# Patient Record
Sex: Female | Born: 1945 | Race: White | Hispanic: No | Marital: Single | Smoking: Never smoker
Health system: Southern US, Community
[De-identification: ages and names within clinical notes are randomized; demographics above are authoritative.]

## PROBLEM LIST (undated history)

## (undated) DIAGNOSIS — R002 Palpitations: Secondary | ICD-10-CM

## (undated) DIAGNOSIS — E119 Type 2 diabetes mellitus without complications: Secondary | ICD-10-CM

## (undated) DIAGNOSIS — I341 Nonrheumatic mitral (valve) prolapse: Secondary | ICD-10-CM

## (undated) DIAGNOSIS — R55 Syncope and collapse: Secondary | ICD-10-CM

## (undated) DIAGNOSIS — I340 Nonrheumatic mitral (valve) insufficiency: Secondary | ICD-10-CM

## (undated) HISTORY — DX: Nonrheumatic mitral (valve) prolapse: I34.1

## (undated) HISTORY — DX: Palpitations: R00.2

## (undated) HISTORY — DX: Nonrheumatic mitral (valve) insufficiency: I34.0

## (undated) HISTORY — DX: Syncope and collapse: R55

---

## 1999-04-19 ENCOUNTER — Other Ambulatory Visit: Admission: RE | Admit: 1999-04-19 | Discharge: 1999-04-19 | Payer: Self-pay | Admitting: Internal Medicine

## 1999-12-18 ENCOUNTER — Encounter: Payer: Self-pay | Admitting: Internal Medicine

## 1999-12-18 ENCOUNTER — Encounter: Admission: RE | Admit: 1999-12-18 | Discharge: 1999-12-18 | Payer: Self-pay | Admitting: Internal Medicine

## 2001-01-22 ENCOUNTER — Encounter: Payer: Self-pay | Admitting: Internal Medicine

## 2001-01-22 ENCOUNTER — Encounter: Admission: RE | Admit: 2001-01-22 | Discharge: 2001-01-22 | Payer: Self-pay | Admitting: Internal Medicine

## 2002-04-15 ENCOUNTER — Encounter: Payer: Self-pay | Admitting: Internal Medicine

## 2002-04-15 ENCOUNTER — Encounter: Admission: RE | Admit: 2002-04-15 | Discharge: 2002-04-15 | Payer: Self-pay | Admitting: Internal Medicine

## 2003-04-21 ENCOUNTER — Encounter: Admission: RE | Admit: 2003-04-21 | Discharge: 2003-04-21 | Payer: Self-pay | Admitting: Internal Medicine

## 2003-06-23 ENCOUNTER — Other Ambulatory Visit: Admission: RE | Admit: 2003-06-23 | Discharge: 2003-06-23 | Payer: Self-pay | Admitting: Internal Medicine

## 2004-04-26 ENCOUNTER — Encounter: Admission: RE | Admit: 2004-04-26 | Discharge: 2004-04-26 | Payer: Self-pay | Admitting: Internal Medicine

## 2004-06-28 ENCOUNTER — Other Ambulatory Visit: Admission: RE | Admit: 2004-06-28 | Discharge: 2004-06-28 | Payer: Self-pay | Admitting: Internal Medicine

## 2005-01-17 ENCOUNTER — Emergency Department: Payer: Self-pay | Admitting: Emergency Medicine

## 2005-01-20 ENCOUNTER — Emergency Department: Payer: Self-pay | Admitting: Emergency Medicine

## 2005-01-24 ENCOUNTER — Emergency Department: Payer: Self-pay | Admitting: Unknown Physician Specialty

## 2005-01-31 ENCOUNTER — Emergency Department: Payer: Self-pay | Admitting: Unknown Physician Specialty

## 2005-02-14 ENCOUNTER — Emergency Department: Payer: Self-pay | Admitting: Emergency Medicine

## 2005-05-20 ENCOUNTER — Encounter: Admission: RE | Admit: 2005-05-20 | Discharge: 2005-05-20 | Payer: Self-pay | Admitting: Internal Medicine

## 2005-07-04 ENCOUNTER — Other Ambulatory Visit: Admission: RE | Admit: 2005-07-04 | Discharge: 2005-07-04 | Payer: Self-pay | Admitting: Internal Medicine

## 2006-05-22 ENCOUNTER — Encounter: Admission: RE | Admit: 2006-05-22 | Discharge: 2006-05-22 | Payer: Self-pay | Admitting: Internal Medicine

## 2006-09-22 ENCOUNTER — Other Ambulatory Visit: Admission: RE | Admit: 2006-09-22 | Discharge: 2006-09-22 | Payer: Self-pay | Admitting: Internal Medicine

## 2007-05-28 ENCOUNTER — Encounter: Admission: RE | Admit: 2007-05-28 | Discharge: 2007-05-28 | Payer: Self-pay | Admitting: Internal Medicine

## 2007-09-23 ENCOUNTER — Other Ambulatory Visit: Admission: RE | Admit: 2007-09-23 | Discharge: 2007-09-23 | Payer: Self-pay | Admitting: Internal Medicine

## 2008-06-07 ENCOUNTER — Encounter: Admission: RE | Admit: 2008-06-07 | Discharge: 2008-06-07 | Payer: Self-pay | Admitting: Internal Medicine

## 2008-09-26 ENCOUNTER — Other Ambulatory Visit: Admission: RE | Admit: 2008-09-26 | Discharge: 2008-09-26 | Payer: Self-pay | Admitting: Internal Medicine

## 2009-06-08 ENCOUNTER — Encounter: Admission: RE | Admit: 2009-06-08 | Discharge: 2009-06-08 | Payer: Self-pay | Admitting: Internal Medicine

## 2009-10-11 ENCOUNTER — Other Ambulatory Visit: Admission: RE | Admit: 2009-10-11 | Discharge: 2009-10-11 | Payer: Self-pay | Admitting: Internal Medicine

## 2010-06-11 ENCOUNTER — Encounter
Admission: RE | Admit: 2010-06-11 | Discharge: 2010-06-11 | Payer: Self-pay | Source: Home / Self Care | Attending: Internal Medicine | Admitting: Internal Medicine

## 2010-06-12 ENCOUNTER — Other Ambulatory Visit: Payer: Self-pay | Admitting: Internal Medicine

## 2010-06-12 DIAGNOSIS — R928 Other abnormal and inconclusive findings on diagnostic imaging of breast: Secondary | ICD-10-CM

## 2010-06-17 ENCOUNTER — Ambulatory Visit
Admission: RE | Admit: 2010-06-17 | Discharge: 2010-06-17 | Disposition: A | Discharge status: Home / Self Care | Payer: BC Managed Care – PPO | Source: Ambulatory Visit | Attending: Internal Medicine | Admitting: Internal Medicine

## 2010-06-17 DIAGNOSIS — R928 Other abnormal and inconclusive findings on diagnostic imaging of breast: Secondary | ICD-10-CM

## 2010-10-16 ENCOUNTER — Other Ambulatory Visit (HOSPITAL_COMMUNITY)
Admission: RE | Admit: 2010-10-16 | Discharge: 2010-10-16 | Disposition: A | Discharge status: Home / Self Care | Payer: BC Managed Care – PPO | Source: Ambulatory Visit | Attending: Internal Medicine | Admitting: Internal Medicine

## 2010-10-16 ENCOUNTER — Other Ambulatory Visit: Payer: Self-pay | Admitting: Internal Medicine

## 2010-10-16 DIAGNOSIS — Z01419 Encounter for gynecological examination (general) (routine) without abnormal findings: Secondary | ICD-10-CM | POA: Insufficient documentation

## 2011-01-09 ENCOUNTER — Other Ambulatory Visit: Payer: Self-pay | Admitting: Dermatology

## 2011-05-15 ENCOUNTER — Other Ambulatory Visit: Payer: Self-pay | Admitting: Internal Medicine

## 2011-05-15 DIAGNOSIS — Z1231 Encounter for screening mammogram for malignant neoplasm of breast: Secondary | ICD-10-CM

## 2011-06-19 ENCOUNTER — Ambulatory Visit
Admission: RE | Admit: 2011-06-19 | Discharge: 2011-06-19 | Disposition: A | Discharge status: Home / Self Care | Payer: Medicare Other | Source: Ambulatory Visit | Attending: Internal Medicine | Admitting: Internal Medicine

## 2011-06-19 DIAGNOSIS — Z1231 Encounter for screening mammogram for malignant neoplasm of breast: Secondary | ICD-10-CM

## 2011-08-18 ENCOUNTER — Other Ambulatory Visit: Payer: Self-pay | Admitting: Dermatology

## 2011-08-18 DIAGNOSIS — L500 Allergic urticaria: Secondary | ICD-10-CM | POA: Diagnosis not present

## 2011-08-18 DIAGNOSIS — W57XXXA Bitten or stung by nonvenomous insect and other nonvenomous arthropods, initial encounter: Secondary | ICD-10-CM | POA: Diagnosis not present

## 2011-08-22 DIAGNOSIS — L508 Other urticaria: Secondary | ICD-10-CM | POA: Diagnosis not present

## 2011-09-08 DIAGNOSIS — R030 Elevated blood-pressure reading, without diagnosis of hypertension: Secondary | ICD-10-CM | POA: Diagnosis not present

## 2011-09-08 DIAGNOSIS — L501 Idiopathic urticaria: Secondary | ICD-10-CM | POA: Diagnosis not present

## 2011-09-08 DIAGNOSIS — J309 Allergic rhinitis, unspecified: Secondary | ICD-10-CM | POA: Diagnosis not present

## 2011-10-01 DIAGNOSIS — L821 Other seborrheic keratosis: Secondary | ICD-10-CM | POA: Diagnosis not present

## 2011-10-01 DIAGNOSIS — D239 Other benign neoplasm of skin, unspecified: Secondary | ICD-10-CM | POA: Diagnosis not present

## 2011-10-22 DIAGNOSIS — E559 Vitamin D deficiency, unspecified: Secondary | ICD-10-CM | POA: Diagnosis not present

## 2011-10-22 DIAGNOSIS — E7800 Pure hypercholesterolemia, unspecified: Secondary | ICD-10-CM | POA: Diagnosis not present

## 2011-10-22 DIAGNOSIS — L508 Other urticaria: Secondary | ICD-10-CM | POA: Diagnosis not present

## 2011-10-22 DIAGNOSIS — Z23 Encounter for immunization: Secondary | ICD-10-CM | POA: Diagnosis not present

## 2011-10-22 DIAGNOSIS — Z0000 Encounter for general adult medical examination without abnormal findings: Secondary | ICD-10-CM | POA: Diagnosis not present

## 2011-10-22 DIAGNOSIS — Z79899 Other long term (current) drug therapy: Secondary | ICD-10-CM | POA: Diagnosis not present

## 2011-10-22 DIAGNOSIS — E039 Hypothyroidism, unspecified: Secondary | ICD-10-CM | POA: Diagnosis not present

## 2011-12-31 DIAGNOSIS — M722 Plantar fascial fibromatosis: Secondary | ICD-10-CM | POA: Diagnosis not present

## 2012-01-08 DIAGNOSIS — Z0000 Encounter for general adult medical examination without abnormal findings: Secondary | ICD-10-CM | POA: Diagnosis not present

## 2012-01-26 DIAGNOSIS — Z23 Encounter for immunization: Secondary | ICD-10-CM | POA: Diagnosis not present

## 2012-03-19 DIAGNOSIS — H2510 Age-related nuclear cataract, unspecified eye: Secondary | ICD-10-CM | POA: Diagnosis not present

## 2012-05-31 ENCOUNTER — Other Ambulatory Visit: Payer: Self-pay | Admitting: Internal Medicine

## 2012-05-31 DIAGNOSIS — Z1231 Encounter for screening mammogram for malignant neoplasm of breast: Secondary | ICD-10-CM

## 2012-07-01 ENCOUNTER — Ambulatory Visit
Admission: RE | Admit: 2012-07-01 | Discharge: 2012-07-01 | Disposition: A | Discharge status: Home / Self Care | Payer: Medicare Other | Source: Ambulatory Visit | Attending: Internal Medicine | Admitting: Internal Medicine

## 2012-07-01 DIAGNOSIS — Z1231 Encounter for screening mammogram for malignant neoplasm of breast: Secondary | ICD-10-CM

## 2012-07-06 ENCOUNTER — Ambulatory Visit
Admission: RE | Admit: 2012-07-06 | Discharge: 2012-07-06 | Disposition: A | Discharge status: Home / Self Care | Payer: Medicare Other | Source: Ambulatory Visit | Attending: Internal Medicine | Admitting: Internal Medicine

## 2012-07-06 ENCOUNTER — Ambulatory Visit: Admission: RE | Admit: 2012-07-06 | Payer: Medicare Other | Source: Ambulatory Visit

## 2012-07-06 ENCOUNTER — Other Ambulatory Visit: Payer: Self-pay | Admitting: Internal Medicine

## 2012-10-05 DIAGNOSIS — R69 Illness, unspecified: Secondary | ICD-10-CM | POA: Diagnosis not present

## 2012-10-06 DIAGNOSIS — R030 Elevated blood-pressure reading, without diagnosis of hypertension: Secondary | ICD-10-CM | POA: Diagnosis not present

## 2012-10-06 DIAGNOSIS — R002 Palpitations: Secondary | ICD-10-CM | POA: Diagnosis not present

## 2012-10-26 ENCOUNTER — Other Ambulatory Visit (HOSPITAL_COMMUNITY)
Admission: RE | Admit: 2012-10-26 | Discharge: 2012-10-26 | Disposition: A | Discharge status: Home / Self Care | Payer: Medicare Other | Source: Ambulatory Visit | Attending: Internal Medicine | Admitting: Internal Medicine

## 2012-10-26 ENCOUNTER — Other Ambulatory Visit: Payer: Self-pay | Admitting: Internal Medicine

## 2012-10-26 DIAGNOSIS — R030 Elevated blood-pressure reading, without diagnosis of hypertension: Secondary | ICD-10-CM | POA: Diagnosis not present

## 2012-10-26 DIAGNOSIS — D509 Iron deficiency anemia, unspecified: Secondary | ICD-10-CM | POA: Diagnosis not present

## 2012-10-26 DIAGNOSIS — R7989 Other specified abnormal findings of blood chemistry: Secondary | ICD-10-CM | POA: Diagnosis not present

## 2012-10-26 DIAGNOSIS — Z79899 Other long term (current) drug therapy: Secondary | ICD-10-CM | POA: Diagnosis not present

## 2012-10-26 DIAGNOSIS — Z01419 Encounter for gynecological examination (general) (routine) without abnormal findings: Secondary | ICD-10-CM | POA: Diagnosis not present

## 2012-10-26 DIAGNOSIS — Z1331 Encounter for screening for depression: Secondary | ICD-10-CM | POA: Diagnosis not present

## 2012-10-26 DIAGNOSIS — E7800 Pure hypercholesterolemia, unspecified: Secondary | ICD-10-CM | POA: Diagnosis not present

## 2012-10-26 DIAGNOSIS — E559 Vitamin D deficiency, unspecified: Secondary | ICD-10-CM | POA: Diagnosis not present

## 2012-10-26 DIAGNOSIS — L508 Other urticaria: Secondary | ICD-10-CM | POA: Diagnosis not present

## 2012-10-26 DIAGNOSIS — R350 Frequency of micturition: Secondary | ICD-10-CM | POA: Diagnosis not present

## 2012-10-26 DIAGNOSIS — Z1151 Encounter for screening for human papillomavirus (HPV): Secondary | ICD-10-CM | POA: Diagnosis not present

## 2012-10-26 DIAGNOSIS — Z0000 Encounter for general adult medical examination without abnormal findings: Secondary | ICD-10-CM | POA: Diagnosis not present

## 2012-11-03 ENCOUNTER — Ambulatory Visit
Admission: RE | Admit: 2012-11-03 | Discharge: 2012-11-03 | Disposition: A | Discharge status: Home / Self Care | Payer: Medicare Other | Source: Ambulatory Visit | Attending: Internal Medicine | Admitting: Internal Medicine

## 2012-11-03 ENCOUNTER — Other Ambulatory Visit: Payer: Self-pay | Admitting: Internal Medicine

## 2012-11-03 DIAGNOSIS — R319 Hematuria, unspecified: Secondary | ICD-10-CM

## 2012-11-03 DIAGNOSIS — R3129 Other microscopic hematuria: Secondary | ICD-10-CM | POA: Diagnosis not present

## 2012-11-03 DIAGNOSIS — G500 Trigeminal neuralgia: Secondary | ICD-10-CM | POA: Diagnosis not present

## 2012-11-03 DIAGNOSIS — R002 Palpitations: Secondary | ICD-10-CM | POA: Diagnosis not present

## 2012-11-03 DIAGNOSIS — D509 Iron deficiency anemia, unspecified: Secondary | ICD-10-CM | POA: Diagnosis not present

## 2012-11-09 DIAGNOSIS — D2370 Other benign neoplasm of skin of unspecified lower limb, including hip: Secondary | ICD-10-CM | POA: Diagnosis not present

## 2012-11-09 DIAGNOSIS — D2320 Other benign neoplasm of skin of unspecified ear and external auricular canal: Secondary | ICD-10-CM | POA: Diagnosis not present

## 2012-11-09 DIAGNOSIS — D239 Other benign neoplasm of skin, unspecified: Secondary | ICD-10-CM | POA: Diagnosis not present

## 2012-11-09 DIAGNOSIS — D1801 Hemangioma of skin and subcutaneous tissue: Secondary | ICD-10-CM | POA: Diagnosis not present

## 2012-11-09 DIAGNOSIS — L821 Other seborrheic keratosis: Secondary | ICD-10-CM | POA: Diagnosis not present

## 2012-11-17 ENCOUNTER — Other Ambulatory Visit: Payer: Self-pay | Admitting: Gastroenterology

## 2012-11-17 DIAGNOSIS — D131 Benign neoplasm of stomach: Secondary | ICD-10-CM | POA: Diagnosis not present

## 2012-11-17 DIAGNOSIS — K621 Rectal polyp: Secondary | ICD-10-CM | POA: Diagnosis not present

## 2012-11-17 DIAGNOSIS — D509 Iron deficiency anemia, unspecified: Secondary | ICD-10-CM | POA: Diagnosis not present

## 2012-11-17 DIAGNOSIS — D126 Benign neoplasm of colon, unspecified: Secondary | ICD-10-CM | POA: Diagnosis not present

## 2012-11-17 DIAGNOSIS — K620 Anal polyp: Secondary | ICD-10-CM | POA: Diagnosis not present

## 2012-11-17 DIAGNOSIS — D1330 Benign neoplasm of unspecified part of small intestine: Secondary | ICD-10-CM | POA: Diagnosis not present

## 2012-12-22 DIAGNOSIS — E039 Hypothyroidism, unspecified: Secondary | ICD-10-CM | POA: Diagnosis not present

## 2012-12-22 DIAGNOSIS — L299 Pruritus, unspecified: Secondary | ICD-10-CM | POA: Diagnosis not present

## 2012-12-22 DIAGNOSIS — D509 Iron deficiency anemia, unspecified: Secondary | ICD-10-CM | POA: Diagnosis not present

## 2012-12-22 DIAGNOSIS — R002 Palpitations: Secondary | ICD-10-CM | POA: Diagnosis not present

## 2012-12-22 DIAGNOSIS — R21 Rash and other nonspecific skin eruption: Secondary | ICD-10-CM | POA: Diagnosis not present

## 2013-02-09 DIAGNOSIS — Z23 Encounter for immunization: Secondary | ICD-10-CM | POA: Diagnosis not present

## 2013-02-22 DIAGNOSIS — R21 Rash and other nonspecific skin eruption: Secondary | ICD-10-CM | POA: Diagnosis not present

## 2013-02-22 DIAGNOSIS — R002 Palpitations: Secondary | ICD-10-CM | POA: Diagnosis not present

## 2013-02-22 DIAGNOSIS — D509 Iron deficiency anemia, unspecified: Secondary | ICD-10-CM | POA: Diagnosis not present

## 2013-02-22 DIAGNOSIS — L299 Pruritus, unspecified: Secondary | ICD-10-CM | POA: Diagnosis not present

## 2013-03-09 DIAGNOSIS — H1045 Other chronic allergic conjunctivitis: Secondary | ICD-10-CM | POA: Diagnosis not present

## 2013-06-08 ENCOUNTER — Other Ambulatory Visit: Payer: Self-pay

## 2013-06-08 DIAGNOSIS — Z1231 Encounter for screening mammogram for malignant neoplasm of breast: Secondary | ICD-10-CM

## 2013-06-18 ENCOUNTER — Encounter: Payer: Self-pay | Admitting: *Deleted

## 2013-06-18 DIAGNOSIS — R002 Palpitations: Secondary | ICD-10-CM | POA: Insufficient documentation

## 2013-06-18 DIAGNOSIS — D509 Iron deficiency anemia, unspecified: Secondary | ICD-10-CM | POA: Insufficient documentation

## 2013-07-04 DIAGNOSIS — D2310 Other benign neoplasm of skin of unspecified eyelid, including canthus: Secondary | ICD-10-CM | POA: Diagnosis not present

## 2013-07-04 DIAGNOSIS — H04229 Epiphora due to insufficient drainage, unspecified lacrimal gland: Secondary | ICD-10-CM | POA: Diagnosis not present

## 2013-07-07 ENCOUNTER — Inpatient Hospital Stay: Admission: RE | Admit: 2013-07-07 | Payer: Medicare Other | Source: Ambulatory Visit

## 2013-07-26 ENCOUNTER — Ambulatory Visit
Admission: RE | Admit: 2013-07-26 | Discharge: 2013-07-26 | Disposition: A | Discharge status: Home / Self Care | Payer: Medicare Other | Source: Ambulatory Visit

## 2013-07-26 DIAGNOSIS — Z1231 Encounter for screening mammogram for malignant neoplasm of breast: Secondary | ICD-10-CM

## 2013-07-27 ENCOUNTER — Other Ambulatory Visit: Payer: Self-pay | Admitting: Internal Medicine

## 2013-07-27 DIAGNOSIS — R928 Other abnormal and inconclusive findings on diagnostic imaging of breast: Secondary | ICD-10-CM

## 2013-08-09 ENCOUNTER — Ambulatory Visit
Admission: RE | Admit: 2013-08-09 | Discharge: 2013-08-09 | Disposition: A | Discharge status: Home / Self Care | Payer: Medicare Other | Source: Ambulatory Visit | Attending: Internal Medicine | Admitting: Internal Medicine

## 2013-08-09 ENCOUNTER — Other Ambulatory Visit: Payer: Self-pay | Admitting: Internal Medicine

## 2013-08-09 DIAGNOSIS — R928 Other abnormal and inconclusive findings on diagnostic imaging of breast: Secondary | ICD-10-CM

## 2013-08-09 DIAGNOSIS — N6459 Other signs and symptoms in breast: Secondary | ICD-10-CM | POA: Diagnosis not present

## 2013-08-18 DIAGNOSIS — R319 Hematuria, unspecified: Secondary | ICD-10-CM | POA: Diagnosis not present

## 2013-08-18 DIAGNOSIS — K625 Hemorrhage of anus and rectum: Secondary | ICD-10-CM | POA: Diagnosis not present

## 2013-08-25 DIAGNOSIS — D2370 Other benign neoplasm of skin of unspecified lower limb, including hip: Secondary | ICD-10-CM | POA: Diagnosis not present

## 2013-08-25 DIAGNOSIS — L259 Unspecified contact dermatitis, unspecified cause: Secondary | ICD-10-CM | POA: Diagnosis not present

## 2013-08-25 DIAGNOSIS — L820 Inflamed seborrheic keratosis: Secondary | ICD-10-CM | POA: Diagnosis not present

## 2013-08-25 DIAGNOSIS — L821 Other seborrheic keratosis: Secondary | ICD-10-CM | POA: Diagnosis not present

## 2013-11-04 DIAGNOSIS — L508 Other urticaria: Secondary | ICD-10-CM | POA: Diagnosis not present

## 2013-11-04 DIAGNOSIS — Z1331 Encounter for screening for depression: Secondary | ICD-10-CM | POA: Diagnosis not present

## 2013-11-04 DIAGNOSIS — L299 Pruritus, unspecified: Secondary | ICD-10-CM | POA: Diagnosis not present

## 2013-11-04 DIAGNOSIS — D509 Iron deficiency anemia, unspecified: Secondary | ICD-10-CM | POA: Diagnosis not present

## 2013-11-04 DIAGNOSIS — E559 Vitamin D deficiency, unspecified: Secondary | ICD-10-CM | POA: Diagnosis not present

## 2013-11-04 DIAGNOSIS — R21 Rash and other nonspecific skin eruption: Secondary | ICD-10-CM | POA: Diagnosis not present

## 2013-11-04 DIAGNOSIS — Z0000 Encounter for general adult medical examination without abnormal findings: Secondary | ICD-10-CM | POA: Diagnosis not present

## 2013-11-04 DIAGNOSIS — R002 Palpitations: Secondary | ICD-10-CM | POA: Diagnosis not present

## 2013-11-04 DIAGNOSIS — Z23 Encounter for immunization: Secondary | ICD-10-CM | POA: Diagnosis not present

## 2013-11-04 DIAGNOSIS — R7309 Other abnormal glucose: Secondary | ICD-10-CM | POA: Diagnosis not present

## 2013-11-09 DIAGNOSIS — L821 Other seborrheic keratosis: Secondary | ICD-10-CM | POA: Diagnosis not present

## 2013-11-09 DIAGNOSIS — D2320 Other benign neoplasm of skin of unspecified ear and external auricular canal: Secondary | ICD-10-CM | POA: Diagnosis not present

## 2014-01-11 DIAGNOSIS — D509 Iron deficiency anemia, unspecified: Secondary | ICD-10-CM | POA: Diagnosis not present

## 2014-01-11 DIAGNOSIS — L989 Disorder of the skin and subcutaneous tissue, unspecified: Secondary | ICD-10-CM | POA: Diagnosis not present

## 2014-01-11 DIAGNOSIS — E785 Hyperlipidemia, unspecified: Secondary | ICD-10-CM | POA: Diagnosis not present

## 2014-01-11 DIAGNOSIS — R151 Fecal smearing: Secondary | ICD-10-CM | POA: Diagnosis not present

## 2014-01-11 DIAGNOSIS — R7309 Other abnormal glucose: Secondary | ICD-10-CM | POA: Diagnosis not present

## 2014-01-11 DIAGNOSIS — L299 Pruritus, unspecified: Secondary | ICD-10-CM | POA: Diagnosis not present

## 2014-01-19 DIAGNOSIS — L2089 Other atopic dermatitis: Secondary | ICD-10-CM | POA: Diagnosis not present

## 2014-02-28 DIAGNOSIS — Z23 Encounter for immunization: Secondary | ICD-10-CM | POA: Diagnosis not present

## 2014-07-12 DIAGNOSIS — E119 Type 2 diabetes mellitus without complications: Secondary | ICD-10-CM | POA: Diagnosis not present

## 2014-07-14 ENCOUNTER — Other Ambulatory Visit: Payer: Self-pay

## 2014-07-14 DIAGNOSIS — Z1231 Encounter for screening mammogram for malignant neoplasm of breast: Secondary | ICD-10-CM

## 2014-08-02 DIAGNOSIS — E1165 Type 2 diabetes mellitus with hyperglycemia: Secondary | ICD-10-CM | POA: Diagnosis not present

## 2014-08-02 DIAGNOSIS — R151 Fecal smearing: Secondary | ICD-10-CM | POA: Diagnosis not present

## 2014-08-02 DIAGNOSIS — M7061 Trochanteric bursitis, right hip: Secondary | ICD-10-CM | POA: Diagnosis not present

## 2014-08-02 DIAGNOSIS — E119 Type 2 diabetes mellitus without complications: Secondary | ICD-10-CM | POA: Diagnosis not present

## 2014-08-21 ENCOUNTER — Ambulatory Visit
Admission: RE | Admit: 2014-08-21 | Discharge: 2014-08-21 | Disposition: A | Discharge status: Home / Self Care | Payer: Medicare Other | Source: Ambulatory Visit

## 2014-08-21 DIAGNOSIS — Z1231 Encounter for screening mammogram for malignant neoplasm of breast: Secondary | ICD-10-CM

## 2014-11-16 ENCOUNTER — Other Ambulatory Visit: Payer: Self-pay | Admitting: Internal Medicine

## 2014-11-16 ENCOUNTER — Other Ambulatory Visit (HOSPITAL_COMMUNITY)
Admission: RE | Admit: 2014-11-16 | Discharge: 2014-11-16 | Disposition: A | Discharge status: Home / Self Care | Payer: Medicare Other | Source: Ambulatory Visit | Attending: Internal Medicine | Admitting: Internal Medicine

## 2014-11-16 DIAGNOSIS — Z79899 Other long term (current) drug therapy: Secondary | ICD-10-CM | POA: Diagnosis not present

## 2014-11-16 DIAGNOSIS — E780 Pure hypercholesterolemia: Secondary | ICD-10-CM | POA: Diagnosis not present

## 2014-11-16 DIAGNOSIS — E119 Type 2 diabetes mellitus without complications: Secondary | ICD-10-CM | POA: Diagnosis not present

## 2014-11-16 DIAGNOSIS — E559 Vitamin D deficiency, unspecified: Secondary | ICD-10-CM | POA: Diagnosis not present

## 2014-11-16 DIAGNOSIS — Z124 Encounter for screening for malignant neoplasm of cervix: Secondary | ICD-10-CM | POA: Insufficient documentation

## 2014-11-16 DIAGNOSIS — Z1389 Encounter for screening for other disorder: Secondary | ICD-10-CM | POA: Diagnosis not present

## 2014-11-16 DIAGNOSIS — Z0001 Encounter for general adult medical examination with abnormal findings: Secondary | ICD-10-CM | POA: Diagnosis not present

## 2014-11-16 DIAGNOSIS — R030 Elevated blood-pressure reading, without diagnosis of hypertension: Secondary | ICD-10-CM | POA: Diagnosis not present

## 2014-11-16 DIAGNOSIS — R311 Benign essential microscopic hematuria: Secondary | ICD-10-CM | POA: Diagnosis not present

## 2014-11-16 DIAGNOSIS — D509 Iron deficiency anemia, unspecified: Secondary | ICD-10-CM | POA: Diagnosis not present

## 2014-11-16 DIAGNOSIS — Z8742 Personal history of other diseases of the female genital tract: Secondary | ICD-10-CM | POA: Diagnosis not present

## 2014-11-16 DIAGNOSIS — E039 Hypothyroidism, unspecified: Secondary | ICD-10-CM | POA: Diagnosis not present

## 2014-11-20 DIAGNOSIS — D2239 Melanocytic nevi of other parts of face: Secondary | ICD-10-CM | POA: Diagnosis not present

## 2014-11-20 DIAGNOSIS — D2222 Melanocytic nevi of left ear and external auricular canal: Secondary | ICD-10-CM | POA: Diagnosis not present

## 2014-11-20 DIAGNOSIS — L821 Other seborrheic keratosis: Secondary | ICD-10-CM | POA: Diagnosis not present

## 2014-11-20 LAB — CYTOLOGY - PAP

## 2014-12-18 DIAGNOSIS — M8589 Other specified disorders of bone density and structure, multiple sites: Secondary | ICD-10-CM | POA: Diagnosis not present

## 2014-12-18 DIAGNOSIS — M859 Disorder of bone density and structure, unspecified: Secondary | ICD-10-CM | POA: Diagnosis not present

## 2015-01-08 DIAGNOSIS — H2513 Age-related nuclear cataract, bilateral: Secondary | ICD-10-CM | POA: Diagnosis not present

## 2015-01-08 DIAGNOSIS — H524 Presbyopia: Secondary | ICD-10-CM | POA: Diagnosis not present

## 2015-01-08 DIAGNOSIS — E119 Type 2 diabetes mellitus without complications: Secondary | ICD-10-CM | POA: Diagnosis not present

## 2015-01-30 DIAGNOSIS — Z23 Encounter for immunization: Secondary | ICD-10-CM | POA: Diagnosis not present

## 2015-04-26 DIAGNOSIS — J069 Acute upper respiratory infection, unspecified: Secondary | ICD-10-CM | POA: Diagnosis not present

## 2015-07-26 DIAGNOSIS — J209 Acute bronchitis, unspecified: Secondary | ICD-10-CM | POA: Diagnosis not present

## 2015-07-27 ENCOUNTER — Encounter (HOSPITAL_COMMUNITY): Payer: Self-pay | Admitting: Emergency Medicine

## 2015-07-27 ENCOUNTER — Emergency Department (HOSPITAL_COMMUNITY): Payer: Medicare Other

## 2015-07-27 ENCOUNTER — Emergency Department (HOSPITAL_COMMUNITY)
Admission: EM | Admit: 2015-07-27 | Discharge: 2015-07-27 | Disposition: A | Discharge status: Home / Self Care | Payer: Medicare Other | Attending: Physician Assistant | Admitting: Physician Assistant

## 2015-07-27 DIAGNOSIS — R05 Cough: Secondary | ICD-10-CM | POA: Insufficient documentation

## 2015-07-27 DIAGNOSIS — R0981 Nasal congestion: Secondary | ICD-10-CM | POA: Diagnosis not present

## 2015-07-27 DIAGNOSIS — I341 Nonrheumatic mitral (valve) prolapse: Secondary | ICD-10-CM | POA: Insufficient documentation

## 2015-07-27 DIAGNOSIS — Z880 Allergy status to penicillin: Secondary | ICD-10-CM | POA: Insufficient documentation

## 2015-07-27 DIAGNOSIS — R509 Fever, unspecified: Secondary | ICD-10-CM | POA: Diagnosis not present

## 2015-07-27 DIAGNOSIS — R404 Transient alteration of awareness: Secondary | ICD-10-CM | POA: Diagnosis not present

## 2015-07-27 DIAGNOSIS — Z7984 Long term (current) use of oral hypoglycemic drugs: Secondary | ICD-10-CM | POA: Diagnosis not present

## 2015-07-27 DIAGNOSIS — R55 Syncope and collapse: Secondary | ICD-10-CM

## 2015-07-27 DIAGNOSIS — R110 Nausea: Secondary | ICD-10-CM | POA: Diagnosis not present

## 2015-07-27 DIAGNOSIS — R42 Dizziness and giddiness: Secondary | ICD-10-CM | POA: Diagnosis not present

## 2015-07-27 DIAGNOSIS — Z79899 Other long term (current) drug therapy: Secondary | ICD-10-CM | POA: Insufficient documentation

## 2015-07-27 DIAGNOSIS — R000 Tachycardia, unspecified: Secondary | ICD-10-CM | POA: Diagnosis not present

## 2015-07-27 DIAGNOSIS — R079 Chest pain, unspecified: Secondary | ICD-10-CM | POA: Diagnosis not present

## 2015-07-27 HISTORY — DX: Type 2 diabetes mellitus without complications: E11.9

## 2015-07-27 LAB — CBC WITH DIFFERENTIAL/PLATELET
BASOS ABS: 0 10*3/uL (ref 0.0–0.1)
Basophils Relative: 0 %
EOS PCT: 0 %
Eosinophils Absolute: 0 10*3/uL (ref 0.0–0.7)
HEMATOCRIT: 41.2 % (ref 36.0–46.0)
Hemoglobin: 13.4 g/dL (ref 12.0–15.0)
LYMPHS PCT: 11 %
Lymphs Abs: 0.5 10*3/uL — ABNORMAL LOW (ref 0.7–4.0)
MCH: 29.2 pg (ref 26.0–34.0)
MCHC: 32.5 g/dL (ref 30.0–36.0)
MCV: 89.8 fL (ref 78.0–100.0)
Monocytes Absolute: 0.4 10*3/uL (ref 0.1–1.0)
Monocytes Relative: 9 %
NEUTROS ABS: 3.7 10*3/uL (ref 1.7–7.7)
Neutrophils Relative %: 80 %
PLATELETS: 190 10*3/uL (ref 150–400)
RBC: 4.59 MIL/uL (ref 3.87–5.11)
RDW: 13.5 % (ref 11.5–15.5)
WBC: 4.7 10*3/uL (ref 4.0–10.5)

## 2015-07-27 LAB — COMPREHENSIVE METABOLIC PANEL
ALT: 38 U/L (ref 14–54)
ANION GAP: 10 (ref 5–15)
AST: 37 U/L (ref 15–41)
Albumin: 3.4 g/dL — ABNORMAL LOW (ref 3.5–5.0)
Alkaline Phosphatase: 52 U/L (ref 38–126)
BILIRUBIN TOTAL: 0.3 mg/dL (ref 0.3–1.2)
BUN: 16 mg/dL (ref 6–20)
CO2: 22 mmol/L (ref 22–32)
Calcium: 8.5 mg/dL — ABNORMAL LOW (ref 8.9–10.3)
Chloride: 108 mmol/L (ref 101–111)
Creatinine, Ser: 0.73 mg/dL (ref 0.44–1.00)
GFR calc Af Amer: >60 mL/min (ref >60)
Glucose, Bld: 154 mg/dL — ABNORMAL HIGH (ref 65–99)
POTASSIUM: 4.2 mmol/L (ref 3.5–5.1)
Sodium: 140 mmol/L (ref 135–145)
TOTAL PROTEIN: 6.4 g/dL — AB (ref 6.5–8.1)

## 2015-07-27 LAB — I-STAT TROPONIN, ED: Troponin i, poc: 0 ng/mL (ref 0.00–0.08)

## 2015-07-27 MED ORDER — IOHEXOL 350 MG/ML SOLN
80.0000 mL | Freq: Once | INTRAVENOUS | Status: AC | PRN
  Administered 2015-07-27: 80 mL via INTRAVENOUS

## 2015-07-27 MED ORDER — SODIUM CHLORIDE 0.9 % IV BOLUS (SEPSIS)
1000.0000 mL | Freq: Once | INTRAVENOUS | Status: AC
  Administered 2015-07-27: 1000 mL via INTRAVENOUS

## 2015-07-27 MED ORDER — GUAIFENESIN-CODEINE 100-10 MG/5ML PO SOLN: 5.0000 mL | Freq: Four times a day (QID) | ORAL | Status: DC | PRN

## 2015-07-27 MED ORDER — ONDANSETRON 4 MG PO TBDP
4.0000 mg | ORAL_TABLET | Freq: Once | ORAL | Status: AC
  Administered 2015-07-27: 4 mg via ORAL
  Filled 2015-07-27: qty 1

## 2015-07-27 MED ORDER — PROMETHAZINE HCL 25 MG PO TABS: 25.0000 mg | ORAL_TABLET | Freq: Four times a day (QID) | ORAL | Status: DC | PRN

## 2015-07-27 NOTE — ED Notes (Signed)
Patient transported to CT 

## 2015-07-27 NOTE — ED Notes (Signed)
Pt reports 2 episodes of vasovagal syncope in response to nausea.  Hx of the same.

## 2015-07-27 NOTE — ED Provider Notes (Addendum)
CSN: [id number]     Arrival date & time 07/27/15  1025 History   First MD Initiated Contact with Patient 07/27/15 1035     No chief complaint on file.    (Consider location/radiation/quality/duration/timing/severity/associated sxs/prior Treatment) HPI   70 year old female presenting with vasovagal syncope. Patient has history of chronic vasovagal symptoms syncope. Often associated with nausea.  Yesterday patient was feeling like she had a cough, she was concerned about bronchitis and went to her primary care physician. Patient was sent home with diagnosis of viral syndrome.  This morning she woke up and she felt a little lightheaded and nauseated. She went and unlocked her door she was sure she would need someone to come be with her. She went to the bathroom floor and laid down on it. She said then she "blacked out". She moved to her bed and then she said she "blacked out again". Patient had no fall. Patient says this happens to her multiple times in the past.  She is not on any new medications. Has no chest pain. No recent travel. It was associated after a strong feeling of nausea. She feels much better now that she's received Zofran. She called EMS to be brought to the hospital in order to get a prescription for some type of antiemetic today.   Patient has mild myalgias, feelings of fatigue. Consistent with influenza or viral syndrome that has been going around.        Past Medical History  Diagnosis Date  . Syncope and collapse   . Mitral valve prolapse   . Mitral regurgitation   . Vaso vagal episode     chronic   No past surgical history on file. No family history on file. Social History  Substance Use Topics  . Smoking status: Never Smoker   . Smokeless tobacco: Not on file  . Alcohol Use: No   OB History    No data available     Review of Systems  Constitutional: Positive for fever. Negative for activity change.  HENT: Positive for congestion.   Respiratory:  Positive for cough. Negative for chest tightness.   Cardiovascular: Negative for chest pain.  Gastrointestinal: Negative for abdominal distention.  Genitourinary: Negative for dysuria.  Musculoskeletal: Negative for joint swelling.  Skin: Negative for rash.  Allergic/Immunologic: Negative for immunocompromised state.  Neurological: Negative for seizures and speech difficulty.  Psychiatric/Behavioral: Negative for behavioral problems and agitation.      Allergies  Codeine; Colchicine; Iron; Metformin and related; and Penicillins  Home Medications   Prior to Admission medications   Medication Sig Start Date End Date Taking? Authorizing Provider  acetaminophen (TYLENOL) 500 MG tablet Take 500 mg by mouth every 6 (six) hours as needed for mild pain.   Yes Historical Provider, MD  benzonatate (TESSALON) 100 MG capsule Take 200 mg by mouth 3 (three) times daily as needed for cough.   Yes Historical Provider, MD  Cholecalciferol (VITAMIN D) 2000 UNITS tablet Take 2,000 Units by mouth daily.   Yes Historical Provider, MD  ferrous sulfate 325 (65 FE) MG tablet Take 325 mg by mouth daily with breakfast.   Yes Historical Provider, MD  guaiFENesin (ROBITUSSIN) 100 MG/5ML liquid Take 200 mg by mouth 3 (three) times daily as needed for cough.   Yes Historical Provider, MD  metFORMIN (GLUMETZA) 500 MG (MOD) 24 hr tablet Take 500 mg by mouth daily with breakfast.   Yes Historical Provider, MD  METOPROLOL SUCCINATE ER PO Take 25 mg by mouth daily.  Yes Historical Provider, MD  rosuvastatin (CRESTOR) 10 MG tablet Take 5 mg by mouth daily.   Yes Historical Provider, MD  doxepin (SINEQUAN) 10 MG capsule Take 10 mg by mouth at bedtime as needed.    Historical Provider, MD   BP 103/57 mmHg  Pulse 108  Temp(Src) 99 F (37.2 C) (Oral)  Resp 16  SpO2 94% Physical Exam  Constitutional: She is oriented to person, place, and time. She appears well-developed and well-nourished.  HENT:  Head: Normocephalic  and atraumatic.  Eyes: Conjunctivae are normal. Right eye exhibits no discharge.  Neck: Neck supple.  Cardiovascular: Normal rate, regular rhythm and normal heart sounds.   No murmur heard. Pulmonary/Chest: Effort normal and breath sounds normal. She has no wheezes. She has no rales.  Abdominal: Soft. She exhibits no distension. There is no tenderness.  Musculoskeletal: Normal range of motion. She exhibits no edema.  Neurological: She is oriented to person, place, and time. No cranial nerve deficit.  Skin: Skin is warm and dry. No rash noted. She is not diaphoretic.  Psychiatric: She has a normal mood and affect. Her behavior is normal.  Nursing note and vitals reviewed.   ED Course  Procedures (including critical care time) Labs Review Labs Reviewed  COMPREHENSIVE METABOLIC PANEL - Abnormal; Notable for the following:    Glucose, Bld 154 (*)    Calcium 8.5 (*)    Total Protein 6.4 (*)    Albumin 3.4 (*)    All other components within normal limits  CBC WITH DIFFERENTIAL/PLATELET - Abnormal; Notable for the following:    Lymphs Abs 0.5 (*)    All other components within normal limits  I-STAT TROPOININ, ED    Imaging Review Dg Chest 2 View  07/27/2015  CLINICAL DATA:  Chest pain, nausea and dizziness EXAM: CHEST  2 VIEW COMPARISON:  02/19/2011 FINDINGS: The heart size and mediastinal contours are within normal limits. Both lungs are clear. The visualized skeletal structures are unremarkable. IMPRESSION: No active cardiopulmonary disease. Electronically Signed   By: Jerilynn Mages.  Shick M.D.   On: 07/27/2015 11:57   I have personally reviewed and evaluated these images and lab results as part of my medical decision-making.   EKG Interpretation   Date/Time:  Friday July 27 2015 10:30:10 EDT Ventricular Rate:  106 PR Interval:  224 QRS Duration: 87 QT Interval:  318 QTC Calculation: 422 R Axis:   58 Text Interpretation:  Sinus or ectopic atrial tachycardia Prolonged PR  interval does  not meet STEMI requirements  Normal sinus rhythm No  prolonged QT.   Confirmed by Gerald Leitz ([id number]) on 07/27/2015  11:15:29 AM      MDM   Final diagnoses:  None     Patient is a 70 year old female with history of vasovagal syncope presenting today with vasovagal syncope. (Her chart states mitral valve prolapse but she deneis this history. Patient today was feeling nauseated laid on the floor and then "blacked out". Beforehand she felt hot and chilled. Patient had the same overwhelming feelings again, laid down on the bed and had a syncopal event. Patient is not concerned about the syncope. She says this happens to her often however she would like an anitemetic today.  Patient has a normal physical exam and vital signs at time that I interviewed her.  I suspect flu versus viral illness.  We will get EKG, chest x-ray, labs and initial troponin.   2:10 PM Patietn feels very improved. Does not want admission for syncope, would  like nausea medication and cough medicine.   3:11 PM Despite 2 L of fluids, patient remains tachycardic.  Given syncope, will get CT angio to rule out PE. If negative, will plan to discharge.   Amaurie Wandel Julio Alm, MD 07/27/15 [place], MD 07/27/15 [id number]

## 2015-07-27 NOTE — ED Notes (Signed)
Patient comes from home. Patient was seen and Dx with bronchitis. States woke up this morning  and had syncope. Patient Alert on arrival. Patient given 8mg  zofran with EMS.

## 2015-07-27 NOTE — Discharge Instructions (Signed)
Please return immediately with any concerns.

## 2015-07-27 NOTE — ED Notes (Signed)
Pt ambulated with standby assist to the restroom with ease.

## 2015-08-07 ENCOUNTER — Other Ambulatory Visit: Payer: Self-pay

## 2015-08-07 DIAGNOSIS — Z1231 Encounter for screening mammogram for malignant neoplasm of breast: Secondary | ICD-10-CM

## 2015-09-18 ENCOUNTER — Ambulatory Visit
Admission: RE | Admit: 2015-09-18 | Discharge: 2015-09-18 | Disposition: A | Discharge status: Home / Self Care | Payer: Medicare Other | Source: Ambulatory Visit

## 2015-09-18 DIAGNOSIS — Z1231 Encounter for screening mammogram for malignant neoplasm of breast: Secondary | ICD-10-CM

## 2015-11-20 DIAGNOSIS — D2239 Melanocytic nevi of other parts of face: Secondary | ICD-10-CM | POA: Diagnosis not present

## 2015-11-20 DIAGNOSIS — D1801 Hemangioma of skin and subcutaneous tissue: Secondary | ICD-10-CM | POA: Diagnosis not present

## 2015-11-20 DIAGNOSIS — D2371 Other benign neoplasm of skin of right lower limb, including hip: Secondary | ICD-10-CM | POA: Diagnosis not present

## 2015-11-20 DIAGNOSIS — L821 Other seborrheic keratosis: Secondary | ICD-10-CM | POA: Diagnosis not present

## 2015-11-27 DIAGNOSIS — E7800 Pure hypercholesterolemia, unspecified: Secondary | ICD-10-CM | POA: Diagnosis not present

## 2015-11-27 DIAGNOSIS — E039 Hypothyroidism, unspecified: Secondary | ICD-10-CM | POA: Diagnosis not present

## 2015-11-27 DIAGNOSIS — D509 Iron deficiency anemia, unspecified: Secondary | ICD-10-CM | POA: Diagnosis not present

## 2015-11-27 DIAGNOSIS — Z1389 Encounter for screening for other disorder: Secondary | ICD-10-CM | POA: Diagnosis not present

## 2015-11-27 DIAGNOSIS — Z6830 Body mass index (BMI) 30.0-30.9, adult: Secondary | ICD-10-CM | POA: Diagnosis not present

## 2015-11-27 DIAGNOSIS — E669 Obesity, unspecified: Secondary | ICD-10-CM | POA: Diagnosis not present

## 2015-11-27 DIAGNOSIS — E119 Type 2 diabetes mellitus without complications: Secondary | ICD-10-CM | POA: Diagnosis not present

## 2015-11-27 DIAGNOSIS — Z0001 Encounter for general adult medical examination with abnormal findings: Secondary | ICD-10-CM | POA: Diagnosis not present

## 2015-11-27 DIAGNOSIS — I10 Essential (primary) hypertension: Secondary | ICD-10-CM | POA: Diagnosis not present

## 2015-11-27 DIAGNOSIS — E559 Vitamin D deficiency, unspecified: Secondary | ICD-10-CM | POA: Diagnosis not present

## 2015-11-27 DIAGNOSIS — Z79899 Other long term (current) drug therapy: Secondary | ICD-10-CM | POA: Diagnosis not present

## 2015-11-27 DIAGNOSIS — M8588 Other specified disorders of bone density and structure, other site: Secondary | ICD-10-CM | POA: Diagnosis not present

## 2015-11-30 DIAGNOSIS — W57XXXA Bitten or stung by nonvenomous insect and other nonvenomous arthropods, initial encounter: Secondary | ICD-10-CM | POA: Diagnosis not present

## 2015-11-30 DIAGNOSIS — R6883 Chills (without fever): Secondary | ICD-10-CM | POA: Diagnosis not present

## 2015-12-14 DIAGNOSIS — W57XXXA Bitten or stung by nonvenomous insect and other nonvenomous arthropods, initial encounter: Secondary | ICD-10-CM | POA: Diagnosis not present

## 2015-12-14 DIAGNOSIS — T148 Other injury of unspecified body region: Secondary | ICD-10-CM | POA: Diagnosis not present

## 2016-02-05 DIAGNOSIS — H25813 Combined forms of age-related cataract, bilateral: Secondary | ICD-10-CM | POA: Diagnosis not present

## 2016-02-05 DIAGNOSIS — H353121 Nonexudative age-related macular degeneration, left eye, early dry stage: Secondary | ICD-10-CM | POA: Diagnosis not present

## 2016-02-05 DIAGNOSIS — H524 Presbyopia: Secondary | ICD-10-CM | POA: Diagnosis not present

## 2016-02-06 DIAGNOSIS — Z23 Encounter for immunization: Secondary | ICD-10-CM | POA: Diagnosis not present

## 2016-02-14 DIAGNOSIS — H2512 Age-related nuclear cataract, left eye: Secondary | ICD-10-CM | POA: Diagnosis not present

## 2016-02-14 DIAGNOSIS — H25812 Combined forms of age-related cataract, left eye: Secondary | ICD-10-CM | POA: Diagnosis not present

## 2016-03-13 DIAGNOSIS — E7800 Pure hypercholesterolemia, unspecified: Secondary | ICD-10-CM | POA: Diagnosis not present

## 2016-03-13 DIAGNOSIS — E119 Type 2 diabetes mellitus without complications: Secondary | ICD-10-CM | POA: Diagnosis not present

## 2016-03-13 DIAGNOSIS — Z7984 Long term (current) use of oral hypoglycemic drugs: Secondary | ICD-10-CM | POA: Diagnosis not present

## 2016-03-27 DIAGNOSIS — H25811 Combined forms of age-related cataract, right eye: Secondary | ICD-10-CM | POA: Diagnosis not present

## 2016-03-27 DIAGNOSIS — H2511 Age-related nuclear cataract, right eye: Secondary | ICD-10-CM | POA: Diagnosis not present

## 2016-04-17 DIAGNOSIS — L820 Inflamed seborrheic keratosis: Secondary | ICD-10-CM | POA: Diagnosis not present

## 2016-04-17 DIAGNOSIS — L309 Dermatitis, unspecified: Secondary | ICD-10-CM | POA: Diagnosis not present

## 2016-04-17 DIAGNOSIS — L570 Actinic keratosis: Secondary | ICD-10-CM | POA: Diagnosis not present

## 2016-04-23 ENCOUNTER — Ambulatory Visit (INDEPENDENT_AMBULATORY_CARE_PROVIDER_SITE_OTHER): Payer: Medicare Other | Admitting: Podiatry

## 2016-04-23 ENCOUNTER — Encounter: Payer: Self-pay | Admitting: Podiatry

## 2016-04-23 VITALS — BP 143/71 | HR 64

## 2016-04-23 DIAGNOSIS — M79609 Pain in unspecified limb: Secondary | ICD-10-CM

## 2016-04-23 DIAGNOSIS — L603 Nail dystrophy: Secondary | ICD-10-CM | POA: Diagnosis not present

## 2016-04-23 DIAGNOSIS — B351 Tinea unguium: Secondary | ICD-10-CM | POA: Diagnosis not present

## 2016-04-23 DIAGNOSIS — L608 Other nail disorders: Secondary | ICD-10-CM

## 2016-04-25 MED ORDER — NONFORMULARY OR COMPOUNDED ITEM: 1.0000 [drp] | Freq: Every day | 2 refills | Status: DC

## 2016-04-25 NOTE — Progress Notes (Signed)
Subjective: Patient presents today for possible treatment and evaluation of fungal nails bilaterally great toes. Patient states that the nails have been discolored and thickened for greater than 1 month. Patient presents today for further treatment and evaluation. Patient is also concerned that possibly ingrown. Patient presents today for further treatment and evaluation  Objective: Physical Exam General: The patient is alert and oriented x3 in no acute distress.  Dermatology: Hyperkeratotic, discolored, thickened, onychodystrophy of nails noted bilaterally.  Skin is warm, dry and supple bilateral lower extremities. Negative for open lesions or macerations.  Vascular: Palpable pedal pulses bilaterally. No edema or erythema noted. Capillary refill within normal limits.  Neurological: Epicritic and protective threshold grossly intact bilaterally.   Musculoskeletal Exam: Range of motion within normal limits to all pedal and ankle joints bilateral. Muscle strength 5/5 in all groups bilateral.   Assessment: #1 onychodystrophy bilateral great toenails #2 possible onychomycosis bilateral great toenails  Plan of Care:  #1 Patient was evaluated. #2 today we discussed nail pathology and etiology regarding nail dystrophy. All patient questions were answered. #3 Rx for shertech antifungal nail lacquer #4 return to clinic prn  Edrick Kins, DPM [facility]  Dr. Edrick Kins, [address]                                        [place], [place] [address]                Office [phone]  Fax [phone]

## 2016-04-25 NOTE — Addendum Note (Signed)
Addended by: Johnnye Lana A on: 04/25/2016 04:59 PM   Modules accepted: Orders

## 2016-05-16 ENCOUNTER — Encounter (HOSPITAL_COMMUNITY): Payer: Self-pay | Admitting: Emergency Medicine

## 2016-05-16 ENCOUNTER — Emergency Department (HOSPITAL_COMMUNITY)
Admission: EM | Admit: 2016-05-16 | Discharge: 2016-05-16 | Disposition: A | Discharge status: Home / Self Care | Payer: Medicare Other | Attending: Emergency Medicine | Admitting: Emergency Medicine

## 2016-05-16 DIAGNOSIS — Z79899 Other long term (current) drug therapy: Secondary | ICD-10-CM | POA: Insufficient documentation

## 2016-05-16 DIAGNOSIS — E119 Type 2 diabetes mellitus without complications: Secondary | ICD-10-CM | POA: Diagnosis not present

## 2016-05-16 DIAGNOSIS — Z7984 Long term (current) use of oral hypoglycemic drugs: Secondary | ICD-10-CM | POA: Diagnosis not present

## 2016-05-16 DIAGNOSIS — R112 Nausea with vomiting, unspecified: Secondary | ICD-10-CM | POA: Diagnosis present

## 2016-05-16 DIAGNOSIS — K529 Noninfective gastroenteritis and colitis, unspecified: Secondary | ICD-10-CM | POA: Insufficient documentation

## 2016-05-16 DIAGNOSIS — K591 Functional diarrhea: Secondary | ICD-10-CM | POA: Diagnosis not present

## 2016-05-16 LAB — I-STAT CHEM 8, ED
BUN: 29 mg/dL — ABNORMAL HIGH (ref 6–20)
CALCIUM ION: 1.13 mmol/L — AB (ref 1.15–1.40)
Chloride: 107 mmol/L (ref 101–111)
Creatinine, Ser: 0.7 mg/dL (ref 0.44–1.00)
Glucose, Bld: 163 mg/dL — ABNORMAL HIGH (ref 65–99)
HCT: 37 % (ref 36.0–46.0)
Hemoglobin: 12.6 g/dL (ref 12.0–15.0)
POTASSIUM: 4.4 mmol/L (ref 3.5–5.1)
SODIUM: 142 mmol/L (ref 135–145)
TCO2: 25 mmol/L (ref 0–100)

## 2016-05-16 MED ORDER — ONDANSETRON HCL 4 MG/2ML IJ SOLN
4.0000 mg | Freq: Once | INTRAMUSCULAR | Status: AC
  Administered 2016-05-16: 4 mg via INTRAVENOUS
  Filled 2016-05-16: qty 2

## 2016-05-16 MED ORDER — DIPHENOXYLATE-ATROPINE 2.5-0.025 MG PO TABS: 1.0000 | ORAL_TABLET | Freq: Four times a day (QID) | ORAL | 0 refills | Status: AC | PRN

## 2016-05-16 MED ORDER — SODIUM CHLORIDE 0.9 % IV BOLUS (SEPSIS)
2000.0000 mL | Freq: Once | INTRAVENOUS | Status: AC
  Administered 2016-05-16: 2000 mL via INTRAVENOUS

## 2016-05-16 MED ORDER — ONDANSETRON 4 MG PO TBDP: 4.0000 mg | ORAL_TABLET | Freq: Three times a day (TID) | ORAL | 0 refills | Status: DC | PRN

## 2016-05-16 NOTE — ED Triage Notes (Signed)
Per EMS: Pt to ED for N, V, and pt will have syncope with emesis. Pt c/o diarrhea as well. Pt denies any pain. No blood in emesis or stool.   Pt started vomiting at 0230 and has thrown up 6 times since then and diarrhea every time she has thrown up. Pt denies pain at this time.

## 2016-05-16 NOTE — ED Provider Notes (Addendum)
[facility] DEPT Provider Note   CSN: [id number] Arrival date & time: 05/16/16  0518     History   Chief Complaint Chief Complaint  Patient presents with  . Emesis    HPI Brenda Butler is a 71 y.o. female. She was initially evaluation after an episode of nausea vomiting diarrhea through the night. She states she felt well upon returning to bed last night. Awakened at 2 AM with nausea and vomiting diarrhea. She had marked retching. She had an episode of syncope in the bathroom. States this is "because of my vasovagal episodes". Continues with diarrhea through the night. Feels weak and tired.  HPI  Past Medical History:  Diagnosis Date  . Diabetes mellitus without complication ([place])   . Mitral regurgitation   . Mitral valve prolapse   . Syncope and collapse   . Vaso vagal episode    chronic    Patient Active Problem List   Diagnosis Date Noted  . Heart palpitations 06/18/2013  . Anemia, iron deficiency 06/18/2013    History reviewed. No pertinent surgical history.  OB History    No data available       Home Medications    Prior to Admission medications   Medication Sig Start Date End Date Taking? Authorizing Provider  acetaminophen (TYLENOL) 500 MG tablet Take 500 mg by mouth every 6 (six) hours as needed for mild pain.    Historical Provider, MD  benzonatate (TESSALON) 100 MG capsule Take 200 mg by mouth 3 (three) times daily as needed for cough.    Historical Provider, MD  Cholecalciferol (VITAMIN D) 2000 UNITS tablet Take 2,000 Units by mouth daily.    Historical Provider, MD  diphenoxylate-atropine (LOMOTIL) 2.5-0.025 MG tablet Take 1 tablet by mouth 4 (four) times daily as needed for diarrhea or loose stools. 05/16/16   Tanna Furry, MD  doxepin (SINEQUAN) 10 MG capsule Take 10 mg by mouth at bedtime as needed.    Historical Provider, MD  ferrous sulfate 325 (65 FE) MG tablet Take 325 mg by mouth daily with breakfast.    Historical Provider, MD  guaiFENesin  (ROBITUSSIN) 100 MG/5ML liquid Take 200 mg by mouth 3 (three) times daily as needed for cough.    Historical Provider, MD  guaiFENesin-codeine 100-10 MG/5ML syrup Take 5 mLs by mouth every 6 (six) hours as needed for cough. Patient not taking: Reported on 04/23/2016 07/27/15   Courteney Lyn Mackuen, MD  metFORMIN (GLUMETZA) 500 MG (MOD) 24 hr tablet Take 500 mg by mouth daily with breakfast.    Historical Provider, MD  METOPROLOL SUCCINATE ER PO Take 25 mg by mouth daily.    Historical Provider, MD  NONFORMULARY OR COMPOUNDED ITEM Apply 1 drop topically daily. Nail Lacquer: Fluconazole 2% Terbinafine 1% DMSO 04/25/16   Edrick Kins, DPM  ondansetron (ZOFRAN ODT) 4 MG disintegrating tablet Take 1 tablet (4 mg total) by mouth every 8 (eight) hours as needed for nausea. 05/16/16   Tanna Furry, MD  promethazine (PHENERGAN) 25 MG tablet Take 1 tablet (25 mg total) by mouth every 6 (six) hours as needed for nausea or vomiting. Patient not taking: Reported on 04/23/2016 07/27/15   Courteney Lyn Mackuen, MD  rosuvastatin (CRESTOR) 10 MG tablet Take 5 mg by mouth daily.    Historical Provider, MD    Family History History reviewed. No pertinent family history.  Social History Social History  Substance Use Topics  . Smoking status: Never Smoker  . Smokeless tobacco: Not on file  .  Alcohol use No     Allergies   Codeine; Colchicine; Iron; Metformin and related; and Penicillins   Review of Systems Review of Systems  Constitutional: Negative for appetite change, chills, diaphoresis, fatigue and fever.  HENT: Negative for mouth sores, sore throat and trouble swallowing.   Eyes: Negative for visual disturbance.  Respiratory: Negative for cough, chest tightness, shortness of breath and wheezing.   Cardiovascular: Negative for chest pain.  Gastrointestinal: Positive for diarrhea, nausea and vomiting. Negative for abdominal distention and abdominal pain.  Endocrine: Negative for polydipsia, polyphagia  and polyuria.  Genitourinary: Negative for dysuria, frequency and hematuria.  Musculoskeletal: Negative for gait problem.  Skin: Negative for color change, pallor and rash.  Neurological: Positive for syncope. Negative for dizziness, light-headedness and headaches.  Hematological: Does not bruise/bleed easily.  Psychiatric/Behavioral: Negative for behavioral problems and confusion.     Physical Exam Updated Vital Signs BP 140/70   Pulse 86   Temp 97.7 F (36.5 C) (Oral)   SpO2 94%   Physical Exam  Constitutional: She is oriented to person, place, and time. She appears well-developed and well-nourished. No distress.  HENT:  Head: Normocephalic.  Eyes: Conjunctivae are normal. Pupils are equal, round, and reactive to light. No scleral icterus.  Neck: Normal range of motion. Neck supple. No thyromegaly present.  Cardiovascular: Normal rate and regular rhythm.  Exam reveals no gallop and no friction rub.   No murmur heard. Pulmonary/Chest: Effort normal and breath sounds normal. No respiratory distress. She has no wheezes. She has no rales.  Abdominal: Soft. Bowel sounds are normal. She exhibits no distension. There is no tenderness. There is no rebound.  Musculoskeletal: Normal range of motion.  Neurological: She is alert and oriented to person, place, and time.  Skin: Skin is warm and dry. No rash noted.  Psychiatric: She has a normal mood and affect. Her behavior is normal.     ED Treatments / Results  Labs (all labs ordered are listed, but only abnormal results are displayed) Labs Reviewed  I-STAT CHEM 8, ED - Abnormal; Notable for the following:       Result Value   BUN 29 (*)    Glucose, Bld 163 (*)    Calcium, Ion 1.13 (*)    All other components within normal limits    EKG  EKG Interpretation None       Radiology No results found.  Procedures Procedures (including critical care time)  Medications Ordered in ED Medications  ondansetron (ZOFRAN)  injection 4 mg (4 mg Intravenous Given 05/16/16 0602)  sodium chloride 0.9 % bolus 2,000 mL (2,000 mLs Intravenous New Bag/Given 05/16/16 0604)     Initial Impression / Assessment and Plan / ED Course  I have reviewed the triage vital signs and the nursing notes.  Pertinent labs & imaging results that were available during my care of the patient were reviewed by me and considered in my medical decision making (see chart for details).  Clinical Course    Plan IV fluids anti-medics by mouth trial, and likely home.  Final Clinical Impressions(s) / ED Diagnoses   Final diagnoses:  Gastroenteritis    New Prescriptions New Prescriptions   DIPHENOXYLATE-ATROPINE (LOMOTIL) 2.5-0.025 MG TABLET    Take 1 tablet by mouth 4 (four) times daily as needed for diarrhea or loose stools.   ONDANSETRON (ZOFRAN ODT) 4 MG DISINTEGRATING TABLET    Take 1 tablet (4 mg total) by mouth every 8 (eight) hours as needed for nausea.  Tanna Furry, MD 05/16/16 [place], MD 05/16/16 [phone]

## 2016-05-16 NOTE — ED Notes (Signed)
Pt states she understands instructions. All questions answered. Pt home stable with steady gait.

## 2016-05-16 NOTE — Discharge Instructions (Signed)
Clear liquids today. Slowly advance your diet as your symptoms allow.  Zofran dissolving tablet for nausea. Lomotil as needed for diarrhea.

## 2016-08-15 ENCOUNTER — Other Ambulatory Visit: Payer: Self-pay | Admitting: Internal Medicine

## 2016-08-15 DIAGNOSIS — Z1231 Encounter for screening mammogram for malignant neoplasm of breast: Secondary | ICD-10-CM

## 2016-09-19 ENCOUNTER — Ambulatory Visit
Admission: RE | Admit: 2016-09-19 | Discharge: 2016-09-19 | Disposition: A | Discharge status: Home / Self Care | Payer: Medicare Other | Source: Ambulatory Visit | Attending: Internal Medicine | Admitting: Internal Medicine

## 2016-09-19 DIAGNOSIS — Z1231 Encounter for screening mammogram for malignant neoplasm of breast: Secondary | ICD-10-CM

## 2016-10-03 ENCOUNTER — Inpatient Hospital Stay (HOSPITAL_COMMUNITY)
Admission: EM | Admit: 2016-10-03 | Discharge: 2016-10-06 | DRG: 641 | Disposition: A | Discharge status: Home / Self Care | Payer: Medicare Other | Attending: Family Medicine | Admitting: Family Medicine

## 2016-10-03 ENCOUNTER — Emergency Department (HOSPITAL_COMMUNITY): Payer: Medicare Other

## 2016-10-03 ENCOUNTER — Encounter (HOSPITAL_COMMUNITY): Payer: Self-pay

## 2016-10-03 DIAGNOSIS — R05 Cough: Secondary | ICD-10-CM | POA: Diagnosis not present

## 2016-10-03 DIAGNOSIS — R079 Chest pain, unspecified: Secondary | ICD-10-CM | POA: Diagnosis present

## 2016-10-03 DIAGNOSIS — N390 Urinary tract infection, site not specified: Secondary | ICD-10-CM | POA: Diagnosis present

## 2016-10-03 DIAGNOSIS — I10 Essential (primary) hypertension: Secondary | ICD-10-CM | POA: Diagnosis present

## 2016-10-03 DIAGNOSIS — I341 Nonrheumatic mitral (valve) prolapse: Secondary | ICD-10-CM | POA: Diagnosis present

## 2016-10-03 DIAGNOSIS — Z888 Allergy status to other drugs, medicaments and biological substances status: Secondary | ICD-10-CM

## 2016-10-03 DIAGNOSIS — E860 Dehydration: Principal | ICD-10-CM | POA: Diagnosis present

## 2016-10-03 DIAGNOSIS — R0789 Other chest pain: Secondary | ICD-10-CM | POA: Diagnosis not present

## 2016-10-03 DIAGNOSIS — Z7984 Long term (current) use of oral hypoglycemic drugs: Secondary | ICD-10-CM

## 2016-10-03 DIAGNOSIS — R002 Palpitations: Secondary | ICD-10-CM | POA: Diagnosis present

## 2016-10-03 DIAGNOSIS — D509 Iron deficiency anemia, unspecified: Secondary | ICD-10-CM | POA: Diagnosis present

## 2016-10-03 DIAGNOSIS — Z885 Allergy status to narcotic agent status: Secondary | ICD-10-CM

## 2016-10-03 DIAGNOSIS — I340 Nonrheumatic mitral (valve) insufficiency: Secondary | ICD-10-CM | POA: Diagnosis present

## 2016-10-03 DIAGNOSIS — R55 Syncope and collapse: Secondary | ICD-10-CM | POA: Diagnosis not present

## 2016-10-03 DIAGNOSIS — Z7901 Long term (current) use of anticoagulants: Secondary | ICD-10-CM

## 2016-10-03 DIAGNOSIS — I471 Supraventricular tachycardia: Secondary | ICD-10-CM | POA: Diagnosis not present

## 2016-10-03 DIAGNOSIS — B9620 Unspecified Escherichia coli [E. coli] as the cause of diseases classified elsewhere: Secondary | ICD-10-CM | POA: Diagnosis present

## 2016-10-03 DIAGNOSIS — J069 Acute upper respiratory infection, unspecified: Secondary | ICD-10-CM | POA: Diagnosis not present

## 2016-10-03 DIAGNOSIS — R404 Transient alteration of awareness: Secondary | ICD-10-CM | POA: Diagnosis not present

## 2016-10-03 DIAGNOSIS — E119 Type 2 diabetes mellitus without complications: Secondary | ICD-10-CM | POA: Diagnosis present

## 2016-10-03 DIAGNOSIS — I480 Paroxysmal atrial fibrillation: Secondary | ICD-10-CM | POA: Diagnosis present

## 2016-10-03 DIAGNOSIS — Z880 Allergy status to penicillin: Secondary | ICD-10-CM

## 2016-10-03 DIAGNOSIS — Z79899 Other long term (current) drug therapy: Secondary | ICD-10-CM

## 2016-10-03 DIAGNOSIS — Z87892 Personal history of anaphylaxis: Secondary | ICD-10-CM

## 2016-10-03 LAB — CBC WITH DIFFERENTIAL/PLATELET
BASOS ABS: 0 10*3/uL (ref 0.0–0.1)
BASOS PCT: 0 %
EOS PCT: 0 %
Eosinophils Absolute: 0 10*3/uL (ref 0.0–0.7)
HCT: 42.9 % (ref 36.0–46.0)
Hemoglobin: 13.9 g/dL (ref 12.0–15.0)
Lymphocytes Relative: 10 %
Lymphs Abs: 0.6 10*3/uL — ABNORMAL LOW (ref 0.7–4.0)
MCH: 28.8 pg (ref 26.0–34.0)
MCHC: 32.4 g/dL (ref 30.0–36.0)
MCV: 88.8 fL (ref 78.0–100.0)
MONO ABS: 0.2 10*3/uL (ref 0.1–1.0)
MONOS PCT: 3 %
Neutro Abs: 5.3 10*3/uL (ref 1.7–7.7)
Neutrophils Relative %: 87 %
PLATELETS: 185 10*3/uL (ref 150–400)
RBC: 4.83 MIL/uL (ref 3.87–5.11)
RDW: 13 % (ref 11.5–15.5)
WBC: 6.2 10*3/uL (ref 4.0–10.5)

## 2016-10-03 LAB — COMPREHENSIVE METABOLIC PANEL
ALT: 46 U/L (ref 14–54)
ANION GAP: 7 (ref 5–15)
AST: 38 U/L (ref 15–41)
Albumin: 3.9 g/dL (ref 3.5–5.0)
Alkaline Phosphatase: 60 U/L (ref 38–126)
BILIRUBIN TOTAL: 0.5 mg/dL (ref 0.3–1.2)
BUN: 15 mg/dL (ref 6–20)
CHLORIDE: 105 mmol/L (ref 101–111)
CO2: 24 mmol/L (ref 22–32)
Calcium: 8.6 mg/dL — ABNORMAL LOW (ref 8.9–10.3)
Creatinine, Ser: 0.61 mg/dL (ref 0.44–1.00)
GFR calc Af Amer: >60 mL/min (ref >60)
Glucose, Bld: 160 mg/dL — ABNORMAL HIGH (ref 65–99)
POTASSIUM: 3.9 mmol/L (ref 3.5–5.1)
Sodium: 136 mmol/L (ref 135–145)
TOTAL PROTEIN: 6.6 g/dL (ref 6.5–8.1)

## 2016-10-03 LAB — URINALYSIS, ROUTINE W REFLEX MICROSCOPIC
BILIRUBIN URINE: NEGATIVE
GLUCOSE, UA: NEGATIVE mg/dL
Ketones, ur: 5 mg/dL — AB
NITRITE: NEGATIVE
PH: 6 (ref 5.0–8.0)
Protein, ur: NEGATIVE mg/dL
SPECIFIC GRAVITY, URINE: 1.015 (ref 1.005–1.030)

## 2016-10-03 LAB — I-STAT TROPONIN, ED: Troponin i, poc: 0 ng/mL (ref 0.00–0.08)

## 2016-10-03 LAB — D-DIMER, QUANTITATIVE: D-Dimer, Quant: 0.4 ug/mL-FEU (ref 0.00–0.50)

## 2016-10-03 LAB — I-STAT CG4 LACTIC ACID, ED: Lactic Acid, Venous: 0.88 mmol/L (ref 0.5–1.9)

## 2016-10-03 MED ORDER — SODIUM CHLORIDE 0.9 % IV BOLUS (SEPSIS)
1000.0000 mL | Freq: Once | INTRAVENOUS | Status: AC
  Administered 2016-10-03: 1000 mL via INTRAVENOUS

## 2016-10-03 MED ORDER — SODIUM CHLORIDE 0.9 % IV SOLN
INTRAVENOUS | Status: AC
Start: 2016-10-03 — End: 2016-10-04
  Administered 2016-10-03 – 2016-10-04 (×2): via INTRAVENOUS

## 2016-10-03 MED ORDER — ONDANSETRON HCL 4 MG/2ML IJ SOLN
4.0000 mg | Freq: Once | INTRAMUSCULAR | Status: AC
  Administered 2016-10-03: 4 mg via INTRAVENOUS
  Filled 2016-10-03: qty 2

## 2016-10-03 MED ORDER — LEVOFLOXACIN IN D5W 750 MG/150ML IV SOLN: 750.0000 mg | INTRAVENOUS | Status: DC

## 2016-10-03 MED ORDER — LEVOFLOXACIN IN D5W 250 MG/50ML IV SOLN: 250.0000 mg | INTRAVENOUS | Status: DC

## 2016-10-03 MED ORDER — BACID PO TABS
2.0000 | ORAL_TABLET | Freq: Three times a day (TID) | ORAL | Status: DC
  Administered 2016-10-04 – 2016-10-06 (×7): 2 via ORAL
  Filled 2016-10-03 (×8): qty 2

## 2016-10-03 MED ORDER — DEXTROSE 5 % IV SOLN
1.0000 g | Freq: Every day | INTRAVENOUS | Status: DC
  Administered 2016-10-03 – 2016-10-04 (×2): 1 g via INTRAVENOUS
  Filled 2016-10-03 (×4): qty 10

## 2016-10-03 MED ORDER — PROMETHAZINE HCL 25 MG/ML IJ SOLN
12.5000 mg | Freq: Four times a day (QID) | INTRAMUSCULAR | Status: DC | PRN
  Administered 2016-10-03: 12.5 mg via INTRAVENOUS
  Filled 2016-10-03 (×2): qty 1

## 2016-10-03 NOTE — ED Notes (Addendum)
Patient having chest pain at this time, states that it feels like a pressure.  She states she is feeling worse, now having more nausea with the pain.  MD notified.

## 2016-10-03 NOTE — ED Triage Notes (Signed)
Pt presents to the ed with ems after having an unwitnessed syncopal episode at home. Reports recently having a virus and having a lot of nausea and vomiting.  She denies any injury from the fall. Alert and oriented on arrival.  En route with ems the patient passed out after ems sat her up lasting a couple seconds.  Denies any pain. edp at bedside

## 2016-10-03 NOTE — ED Provider Notes (Signed)
[facility] DEPT Provider Note   CSN: [id number] Arrival date & time: 10/03/16  1717     History   Chief Complaint Chief Complaint  Patient presents with  . Loss of Consciousness    HPI Brenda Butler is a 71 y.o. female hx of DM, syncope, Here presenting with syncope. Patient states that she has been coughing for the last several days so went to her doctor this morning. He was prescribed some cough medicine. This evening, she had some loose stool and then passed out in the bathroom. Denies head injury. Patient states that she passed out before when she had a virus. Denies hx of CAD.   The history is provided by the patient.    Past Medical History:  Diagnosis Date  . Diabetes mellitus without complication ([place])   . Mitral regurgitation   . Mitral valve prolapse   . Syncope and collapse   . Vaso vagal episode    chronic    Patient Active Problem List   Diagnosis Date Noted  . Heart palpitations 06/18/2013  . Anemia, iron deficiency 06/18/2013    History reviewed. No pertinent surgical history.  OB History    No data available       Home Medications    Prior to Admission medications   Medication Sig Start Date End Date Taking? Authorizing Provider  acetaminophen (TYLENOL) 500 MG tablet Take 500 mg by mouth every 6 (six) hours as needed for mild pain.    [provider]  benzonatate (TESSALON) 100 MG capsule Take 200 mg by mouth 3 (three) times daily as needed for cough.    [provider]  Cholecalciferol (VITAMIN D) 2000 UNITS tablet Take 2,000 Units by mouth daily.    [provider]  diphenoxylate-atropine (LOMOTIL) 2.5-0.025 MG tablet Take 1 tablet by mouth 4 (four) times daily as needed for diarrhea or loose stools. 05/16/16   Tanna Furry, MD  doxepin (SINEQUAN) 10 MG capsule Take 10 mg by mouth at bedtime as needed.    [provider]  ferrous sulfate 325 (65 FE) MG tablet Take 325 mg by mouth daily with breakfast.     [provider]  guaiFENesin (ROBITUSSIN) 100 MG/5ML liquid Take 200 mg by mouth 3 (three) times daily as needed for cough.    [provider]  guaiFENesin-codeine 100-10 MG/5ML syrup Take 5 mLs by mouth every 6 (six) hours as needed for cough. Patient not taking: Reported on 04/23/2016 07/27/15   Mackuen, Courteney Lyn, MD  metFORMIN (GLUMETZA) 500 MG (MOD) 24 hr tablet Take 500 mg by mouth daily with breakfast.    [provider]  METOPROLOL SUCCINATE ER PO Take 25 mg by mouth daily.    [provider]  NONFORMULARY OR COMPOUNDED ITEM Apply 1 drop topically daily. Nail Lacquer: Fluconazole 2% Terbinafine 1% DMSO 04/25/16   Evans, Dorathy Daft, DPM  ondansetron (ZOFRAN ODT) 4 MG disintegrating tablet Take 1 tablet (4 mg total) by mouth every 8 (eight) hours as needed for nausea. 05/16/16   Tanna Furry, MD  promethazine (PHENERGAN) 25 MG tablet Take 1 tablet (25 mg total) by mouth every 6 (six) hours as needed for nausea or vomiting. Patient not taking: Reported on 04/23/2016 07/27/15   Mackuen, Courteney Lyn, MD  rosuvastatin (CRESTOR) 10 MG tablet Take 5 mg by mouth daily.    [provider]    Family History Family History  Problem Relation Age of Onset  . Breast cancer Sister  Social History Social History  Substance Use Topics  . Smoking status: Never Smoker  . Smokeless tobacco: Not on file  . Alcohol use No     Allergies   Codeine; Colchicine; Iron; Metformin and related; and Penicillins   Review of Systems Review of Systems  Respiratory: Positive for cough.   Neurological: Positive for syncope.  All other systems reviewed and are negative.    Physical Exam Updated Vital Signs BP (!) 165/64 (BP Location: Left Arm)   Pulse 84   Temp 98.9 F (37.2 C) (Oral)   Resp 15   Ht 5\' 8"  (1.727 m)   Wt 77.1 kg (170 lb)   SpO2 94%   BMI 25.85 kg/m   Physical Exam  Constitutional: She is oriented to person, place, and time.    Dehydrated  HENT:  Head: Normocephalic and atraumatic.  Mouth/Throat: Oropharynx is clear and moist.  Eyes: EOM are normal. Pupils are equal, round, and reactive to light.  Neck: Normal range of motion. Neck supple.  Cardiovascular: Normal rate, regular rhythm and normal heart sounds.   Pulmonary/Chest: Effort normal.  Diminished bilateral bases, no obvious crackles   Abdominal: Soft. Bowel sounds are normal. She exhibits no distension. There is no tenderness.  Musculoskeletal: Normal range of motion.  Neurological: She is alert and oriented to person, place, and time. No cranial nerve deficit. Coordination normal.  Skin: Skin is warm.  Psychiatric: She has a normal mood and affect.  Nursing note and vitals reviewed.    ED Treatments / Results  Labs (all labs ordered are listed, but only abnormal results are displayed) Labs Reviewed  CBC WITH DIFFERENTIAL/PLATELET  COMPREHENSIVE METABOLIC PANEL  URINALYSIS, ROUTINE W REFLEX MICROSCOPIC  I-STAT TROPOININ, ED  I-STAT CG4 LACTIC ACID, ED    EKG  EKG Interpretation  Date/Time:  Friday Oct 03 2016 17:20:58 EDT Ventricular Rate:  73 PR Interval:    QRS Duration: 91 QT Interval:  405 QTC Calculation: 447 R Axis:   76 Text Interpretation:  Sinus rhythm No significant change since last tracing Confirmed by YAO  MD, DAVID ([id number]) on 10/03/2016 5:32:50 PM       Radiology No results found.  Procedures Procedures (including critical care time)  Medications Ordered in ED Medications  sodium chloride 0.9 % bolus 1,000 mL (not administered)     Initial Impression / Assessment and Plan / ED Course  I have reviewed the triage vital signs and the nursing notes.  Pertinent labs & imaging results that were available during my care of the patient were reviewed by me and considered in my medical decision making (see chart for details).     Brenda Butler is a 71 y.o. female here with syncope. Likely vasovagal vs dehydration.  O2 88-94%, no hx of PE. Will get d-dimer, labs, CXR. Will hydrate and reassess.   9:27 PM Patient developed chest pain in the ED. Trop neg. Repeat EKG urnemarkable. Still dizzy and felt light headed. Will admit for observation for syncope, chest pain.   Final Clinical Impressions(s) / ED Diagnoses   Final diagnoses:  None    New Prescriptions New Prescriptions   No medications on file     Drenda Freeze, MD 10/03/16 2129

## 2016-10-03 NOTE — ED Notes (Signed)
Patient transported to CT 

## 2016-10-03 NOTE — ED Notes (Signed)
Patient sheets spoiled upon entering room, attempted to talk patient into changing sheets but she refused stated "that is the least of my worries right now, I don't really want to move". Notified RN of patient wishes.

## 2016-10-03 NOTE — ED Notes (Signed)
Admitting MD paged for more nausea medication; Pt denies chest pain at present, only c/o dizziness and nausea

## 2016-10-03 NOTE — H&P (Signed)
History and Physical    KOREENA JOOST ZGY:[id number] DOB: May 30, 1945 DOA: 10/03/2016  PCP: [name], MD   Patient coming from: Home  Chief Complaint: Syncope  HPI: Brenda Butler is a 71 y.o. female with medical history significant for vasovagal syncope, heart palpitations, who presented to the ED for complaints of passing out, 1 episode today while she was having a bowel movement- which was most likely lose. She gently lowered herself to the floor, says she was out for about 1 minute. Also had 2 episodes of vomiting today. Patient also endorses chest tightness/pressure, the day before, radiating to her jaw. No family history of premature CAD, never smoker.  She endorses fever- temp 99-100, for which she has been taking Advil, also with chills and diaphoresis. She endorses malaise over the past week, denies frequency dysuria abdominal pain, no shortness of breath, but has a cough- nonproductive. Has a headache mostly frontal, no neck pain or photophobia.    Patient reports having presyncopal episodes over the past 18 months, prior to that she hasn't had a syncopal episode in over 20 years. She was previously diagnosed with vasovagal syncope.  ED Course: Blood pressure elevated- 591-638G systolic, temperature- 66.5, respiratory troponin unremarkable, d-dimer negative, normal WBC 6.2, EKG unremarkable, chest x-ray negative for acute cardiopulmonary disease. Patient was given 1 L bolus in the ED, hospitalist was asked to admit for syncope workup and chest pain.  Review of Systems: As per HPI otherwise 10 point review of systems negative.   Past Medical History:  Diagnosis Date  . Diabetes mellitus without complication ([place])   . Mitral regurgitation   . Mitral valve prolapse   . Syncope and collapse   . Vaso vagal episode    chronic    History reviewed. No pertinent surgical history.   reports that she has never smoked. She does not have any smokeless tobacco history on file. She  reports that she does not drink alcohol or use drugs.  Allergies  Allergen Reactions  . Codeine     Nausea and vomiting  . Colchicine     Lightheadedness  . Iron     nausea  . Metformin And Related Diarrhea  . Penicillins     Anaphylaxis and Nausea and vomiting    Family History  Problem Relation Age of Onset  . Breast cancer Sister     Prior to Admission medications   Medication Sig Start Date End Date Taking? Authorizing Provider  acetaminophen (TYLENOL) 500 MG tablet Take 500 mg by mouth every 6 (six) hours as needed for mild pain.   Yes [provider]  benzonatate (TESSALON) 100 MG capsule Take 200 mg by mouth 3 (three) times daily as needed for cough.   Yes [provider]  Cholecalciferol (VITAMIN D) 2000 UNITS tablet Take 2,000 Units by mouth daily.   Yes [provider]  diphenoxylate-atropine (LOMOTIL) 2.5-0.025 MG tablet Take 1 tablet by mouth 4 (four) times daily as needed for diarrhea or loose stools. 05/16/16  Yes Tanna Furry, MD  doxepin (SINEQUAN) 10 MG capsule Take 10 mg by mouth at bedtime as needed (mood).    Yes [provider]  ferrous sulfate 325 (65 FE) MG tablet Take 325 mg by mouth daily with breakfast.   Yes [provider]  guaiFENesin (ROBITUSSIN) 100 MG/5ML liquid Take 200 mg by mouth 3 (three) times daily as needed for cough.   Yes [provider]  metFORMIN (GLUMETZA) 500 MG (MOD) 24 hr tablet  Take 500 mg by mouth daily with breakfast.   Yes [provider]  METOPROLOL SUCCINATE ER PO Take 25 mg by mouth daily.   Yes [provider]  ondansetron (ZOFRAN ODT) 4 MG disintegrating tablet Take 1 tablet (4 mg total) by mouth every 8 (eight) hours as needed for nausea. 05/16/16  Yes Tanna Furry, MD  promethazine (PHENERGAN) 25 MG tablet Take 1 tablet (25 mg total) by mouth every 6 (six) hours as needed for nausea or vomiting. 07/27/15  Yes Mackuen, Courteney Lyn, MD  rosuvastatin (CRESTOR) 10  MG tablet Take 2.5 mg by mouth 3 (three) times a week.    Yes [provider]    Physical Exam: Vitals:   10/03/16 2145 10/03/16 2200 10/03/16 2215 10/03/16 2245  BP: (!) 155/97 (!) 166/76 (!) 171/71 (!) 176/69  Pulse: 88 87 89 92  Resp: 18 19 (!) 22 14  Temp:      TempSrc:      SpO2: 90% 91% 90% 92%  Weight:      Height:        Constitutional: NAD, calm, comfortable Vitals:   10/03/16 2145 10/03/16 2200 10/03/16 2215 10/03/16 2245  BP: (!) 155/97 (!) 166/76 (!) 171/71 (!) 176/69  Pulse: 88 87 89 92  Resp: 18 19 (!) 22 14  Temp:      TempSrc:      SpO2: 90% 91% 90% 92%  Weight:      Height:       Eyes: PERRL, lids and conjunctivae normal ENMT: Mucous membranes are dry. Posterior pharynx clear of any exudate or lesions Neck: normal, supple, no masses, no thyromegaly Respiratory: clear to auscultation bilaterally, no wheezing, no crackles. Normal respiratory effort. No accessory muscle use.  Cardiovascular: Regular rate and rhythm, no murmurs / rubs / gallops. No extremity edema. 2+ pedal pulses.   Abdomen: no tenderness, no masses palpated. No hepatosplenomegaly. Bowel sounds positive.  Musculoskeletal: no clubbing / cyanosis. No joint deformity upper and lower extremities. Good ROM, no contractures. Normal muscle tone.  Skin: no rashes, lesions, ulcers. No induration Neurologic: CN 2-12 grossly intact. Sensation intact, DTR normal. Strength 5/5 in all 4.  Psychiatric: Normal judgment and insight. Alert and oriented x 3. Normal mood.   Labs on Admission: I have personally reviewed following labs and imaging studies  CBC:  Recent Labs Lab 10/03/16 1920  WBC 6.2  NEUTROABS 5.3  HGB 13.9  HCT 42.9  MCV 88.8  PLT 793   Basic Metabolic Panel:  Recent Labs Lab 10/03/16 1920  NA 136  K 3.9  CL 105  CO2 24  GLUCOSE 160*  BUN 15  CREATININE 0.61  CALCIUM 8.6*   Liver Function Tests:  Recent Labs Lab 10/03/16 1920  AST 38  ALT 46  ALKPHOS 60    BILITOT 0.5  PROT 6.6  ALBUMIN 3.9   Urine analysis:    Component Value Date/Time   COLORURINE YELLOW 10/03/2016 1930   APPEARANCEUR CLOUDY (A) 10/03/2016 1930   LABSPEC 1.015 10/03/2016 1930   PHURINE 6.0 10/03/2016 1930   GLUCOSEU NEGATIVE 10/03/2016 1930   HGBUR MODERATE (A) 10/03/2016 1930   BILIRUBINUR NEGATIVE 10/03/2016 1930   KETONESUR 5 (A) 10/03/2016 1930   PROTEINUR NEGATIVE 10/03/2016 1930   NITRITE NEGATIVE 10/03/2016 1930   LEUKOCYTESUR LARGE (A) 10/03/2016 1930    Radiological Exams on Admission: Dg Chest 2 View  Result Date: 10/03/2016 CLINICAL DATA:  Cough and fever EXAM: CHEST  2 VIEW COMPARISON:  07/27/2015 FINDINGS: The heart size and mediastinal contours are within normal limits. Both lungs are clear. The visualized skeletal structures are unremarkable. IMPRESSION: No active cardiopulmonary disease. Electronically Signed   By: Inez Catalina M.D.   On: 10/03/2016 18:16   EKG: Independently reviewed. No ST or T-wave abnormalities. Unchanged from prior compared to 07/29/15.  Assessment/Plan Principal Problem:   Syncope Active Problems:   Heart palpitations   Anemia, iron deficiency   Syncope, vasovagal   Chest pain  Syncope- possibly vasovagal, considering prior diagnoisis, occurred after having a bowel movement. Likely contribution of dehydration- dry mucous membranes. Orthostatics negative in the ED. - Admits to telemetry, obs - Trops x3 - Echo, no prior Echo, evaluate valves, and chest pain work up - Hydrate- X12  Hrs at 125 cc N/s  UTI- UA shows large leukocyte esterase, few bacteria, 6-30 WBCs. Likely cause of low grade temp. Patient has prior history of anaphylactic reaction to penicillin, she has tolerated Keflex in the past. - Start IV ceftriaxone 1 g daily - Follow up urine cultures - Blood cultures 2, with malaise - She also requests probiotics, while on antibiotics, will order - Hydrate  Chest pain- workup per syncope. No chest pain  today. EKG unchanged. Apart from her age she is low risk for coronary artery disease., Never smoker, family history of coronary artery disease. - Trops X3 - Echo - Heart SCore- 2-3 deprending on risk factors, 0.9-1.7% risk of adverse cardiac event. - Doubt need for extensive workup at this time, also pending further workup and clinical status, can follow-up with primary care provider.   Palpitations- she was diagnosed with frequent PACs, for which was prescribed low-dose metoprolol. She denies hypertension. - Continue metoprolol to inpatient - Blood pressure elevated, monitor             DVT prophylaxis: Lovenox Code Status: Full Family Communication: None at bedside, pts friend present. Disposition Plan: Home Consults called: None Admission status: obs, tele.   Bethena Roys MD [facility] Pager [phone]  If 2am-7AM, please contact night-coverage [email or web address] Password [email or web address]  10/03/2016, 11:14 PM

## 2016-10-04 ENCOUNTER — Encounter (HOSPITAL_COMMUNITY): Payer: Self-pay | Admitting: Cardiology

## 2016-10-04 DIAGNOSIS — Z7984 Long term (current) use of oral hypoglycemic drugs: Secondary | ICD-10-CM | POA: Diagnosis not present

## 2016-10-04 DIAGNOSIS — E119 Type 2 diabetes mellitus without complications: Secondary | ICD-10-CM | POA: Diagnosis present

## 2016-10-04 DIAGNOSIS — Z885 Allergy status to narcotic agent status: Secondary | ICD-10-CM | POA: Diagnosis not present

## 2016-10-04 DIAGNOSIS — N390 Urinary tract infection, site not specified: Secondary | ICD-10-CM | POA: Diagnosis present

## 2016-10-04 DIAGNOSIS — Z7901 Long term (current) use of anticoagulants: Secondary | ICD-10-CM | POA: Diagnosis not present

## 2016-10-04 DIAGNOSIS — I10 Essential (primary) hypertension: Secondary | ICD-10-CM | POA: Diagnosis present

## 2016-10-04 DIAGNOSIS — I471 Supraventricular tachycardia: Secondary | ICD-10-CM | POA: Diagnosis present

## 2016-10-04 DIAGNOSIS — I340 Nonrheumatic mitral (valve) insufficiency: Secondary | ICD-10-CM | POA: Diagnosis not present

## 2016-10-04 DIAGNOSIS — I480 Paroxysmal atrial fibrillation: Secondary | ICD-10-CM | POA: Diagnosis not present

## 2016-10-04 DIAGNOSIS — J069 Acute upper respiratory infection, unspecified: Secondary | ICD-10-CM | POA: Diagnosis present

## 2016-10-04 DIAGNOSIS — R55 Syncope and collapse: Secondary | ICD-10-CM | POA: Diagnosis not present

## 2016-10-04 DIAGNOSIS — B9620 Unspecified Escherichia coli [E. coli] as the cause of diseases classified elsewhere: Secondary | ICD-10-CM | POA: Diagnosis present

## 2016-10-04 DIAGNOSIS — R002 Palpitations: Secondary | ICD-10-CM | POA: Diagnosis present

## 2016-10-04 DIAGNOSIS — Z79899 Other long term (current) drug therapy: Secondary | ICD-10-CM | POA: Diagnosis not present

## 2016-10-04 DIAGNOSIS — E860 Dehydration: Secondary | ICD-10-CM | POA: Diagnosis present

## 2016-10-04 DIAGNOSIS — I341 Nonrheumatic mitral (valve) prolapse: Secondary | ICD-10-CM | POA: Diagnosis present

## 2016-10-04 DIAGNOSIS — Z888 Allergy status to other drugs, medicaments and biological substances status: Secondary | ICD-10-CM | POA: Diagnosis not present

## 2016-10-04 DIAGNOSIS — R079 Chest pain, unspecified: Secondary | ICD-10-CM | POA: Diagnosis present

## 2016-10-04 DIAGNOSIS — Z87892 Personal history of anaphylaxis: Secondary | ICD-10-CM | POA: Diagnosis not present

## 2016-10-04 DIAGNOSIS — D509 Iron deficiency anemia, unspecified: Secondary | ICD-10-CM | POA: Diagnosis present

## 2016-10-04 DIAGNOSIS — Z880 Allergy status to penicillin: Secondary | ICD-10-CM | POA: Diagnosis not present

## 2016-10-04 LAB — BASIC METABOLIC PANEL
ANION GAP: 9 (ref 5–15)
BUN: 10 mg/dL (ref 6–20)
CALCIUM: 8.5 mg/dL — AB (ref 8.9–10.3)
CO2: 23 mmol/L (ref 22–32)
Chloride: 107 mmol/L (ref 101–111)
Creatinine, Ser: 0.72 mg/dL (ref 0.44–1.00)
GFR calc Af Amer: >60 mL/min (ref >60)
GLUCOSE: 130 mg/dL — AB (ref 65–99)
Potassium: 3.7 mmol/L (ref 3.5–5.1)
Sodium: 139 mmol/L (ref 135–145)

## 2016-10-04 LAB — CBC
HCT: 40.7 % (ref 36.0–46.0)
HEMOGLOBIN: 13.1 g/dL (ref 12.0–15.0)
MCH: 28.7 pg (ref 26.0–34.0)
MCHC: 32.2 g/dL (ref 30.0–36.0)
MCV: 89.3 fL (ref 78.0–100.0)
Platelets: 181 10*3/uL (ref 150–400)
RBC: 4.56 MIL/uL (ref 3.87–5.11)
RDW: 13.1 % (ref 11.5–15.5)
WBC: 4.8 10*3/uL (ref 4.0–10.5)

## 2016-10-04 LAB — TROPONIN I

## 2016-10-04 LAB — GLUCOSE, CAPILLARY
GLUCOSE-CAPILLARY: 109 mg/dL — AB (ref 65–99)
GLUCOSE-CAPILLARY: 119 mg/dL — AB (ref 65–99)
GLUCOSE-CAPILLARY: 121 mg/dL — AB (ref 65–99)
GLUCOSE-CAPILLARY: 182 mg/dL — AB (ref 65–99)
Glucose-Capillary: 130 mg/dL — ABNORMAL HIGH (ref 65–99)

## 2016-10-04 MED ORDER — ACETAMINOPHEN 650 MG RE SUPP: 650.0000 mg | Freq: Four times a day (QID) | RECTAL | Status: DC | PRN

## 2016-10-04 MED ORDER — PROMETHAZINE HCL 25 MG/ML IJ SOLN: 12.5000 mg | Freq: Four times a day (QID) | INTRAMUSCULAR | Status: DC | PRN

## 2016-10-04 MED ORDER — FAMOTIDINE 20 MG PO TABS
20.0000 mg | ORAL_TABLET | Freq: Two times a day (BID) | ORAL | Status: DC
  Administered 2016-10-04 – 2016-10-06 (×4): 20 mg via ORAL
  Filled 2016-10-04 (×5): qty 1

## 2016-10-04 MED ORDER — IBUPROFEN 600 MG PO TABS
600.0000 mg | ORAL_TABLET | Freq: Four times a day (QID) | ORAL | Status: DC | PRN
  Administered 2016-10-04 – 2016-10-05 (×3): 600 mg via ORAL
  Filled 2016-10-04 (×3): qty 1

## 2016-10-04 MED ORDER — DOXEPIN HCL 10 MG PO CAPS: 10.0000 mg | ORAL_CAPSULE | Freq: Every evening | ORAL | Status: DC | PRN

## 2016-10-04 MED ORDER — ACETAMINOPHEN 325 MG PO TABS
650.0000 mg | ORAL_TABLET | Freq: Four times a day (QID) | ORAL | Status: DC | PRN
  Administered 2016-10-04 – 2016-10-05 (×4): 650 mg via ORAL
  Filled 2016-10-04 (×4): qty 2

## 2016-10-04 MED ORDER — FERROUS SULFATE 325 (65 FE) MG PO TABS
325.0000 mg | ORAL_TABLET | Freq: Every day | ORAL | Status: DC
  Administered 2016-10-05 – 2016-10-06 (×2): 325 mg via ORAL
  Filled 2016-10-04 (×3): qty 1

## 2016-10-04 MED ORDER — ONDANSETRON HCL 4 MG/2ML IJ SOLN: 4.0000 mg | Freq: Four times a day (QID) | INTRAMUSCULAR | Status: DC | PRN

## 2016-10-04 MED ORDER — METOPROLOL TARTRATE 5 MG/5ML IV SOLN
2.5000 mg | Freq: Once | INTRAVENOUS | Status: AC
  Administered 2016-10-04: 2.5 mg via INTRAVENOUS
  Filled 2016-10-04: qty 5

## 2016-10-04 MED ORDER — METOPROLOL SUCCINATE ER 50 MG PO TB24: 50.0000 mg | ORAL_TABLET | Freq: Every day | ORAL | Status: DC

## 2016-10-04 MED ORDER — ENOXAPARIN SODIUM 40 MG/0.4ML ~~LOC~~ SOLN
40.0000 mg | Freq: Every day | SUBCUTANEOUS | Status: DC
  Administered 2016-10-04: 40 mg via SUBCUTANEOUS
  Filled 2016-10-04: qty 0.4

## 2016-10-04 MED ORDER — METOPROLOL SUCCINATE ER 25 MG PO TB24
25.0000 mg | ORAL_TABLET | Freq: Every day | ORAL | Status: DC
Start: 2016-10-04 — End: 2016-10-04
  Administered 2016-10-04: 25 mg via ORAL
  Filled 2016-10-04: qty 1

## 2016-10-04 MED ORDER — METOCLOPRAMIDE HCL 5 MG/ML IJ SOLN
10.0000 mg | Freq: Four times a day (QID) | INTRAMUSCULAR | Status: DC | PRN
Start: 2016-10-04 — End: 2016-10-06
  Administered 2016-10-04 (×3): 10 mg via INTRAVENOUS
  Filled 2016-10-04 (×3): qty 2

## 2016-10-04 MED ORDER — INSULIN ASPART 100 UNIT/ML ~~LOC~~ SOLN
0.0000 [IU] | Freq: Three times a day (TID) | SUBCUTANEOUS | Status: DC
Start: 2016-10-04 — End: 2016-10-06
  Administered 2016-10-04 – 2016-10-05 (×3): 1 [IU] via SUBCUTANEOUS

## 2016-10-04 MED ORDER — VITAMIN D 1000 UNITS PO TABS
2000.0000 [IU] | ORAL_TABLET | Freq: Every day | ORAL | Status: DC
  Administered 2016-10-05 – 2016-10-06 (×2): 2000 [IU] via ORAL
  Filled 2016-10-04 (×3): qty 2

## 2016-10-04 NOTE — Progress Notes (Signed)
Pt converted to Afib with HR 140's.  MD notified and ordered to give metoprolol 2.5mg  IV.

## 2016-10-04 NOTE — Consult Note (Signed)
Cardiology Consultation:   Patient ID: Brenda Butler; [id number]; November 04, 1945   Admit date: 10/03/2016 Date of Consult: 10/04/2016  Primary Care Provider: [name], MD Primary Cardiologist: Dr. Tamala Julian Primary Electrophysiologist:  NA   Patient Profile:   Brenda Butler is a 71 y.o. female with a hx of vasovagal syncope heart palpations DM and MR who is being seen today for the evaluation of rapid a fib at the request of Dr. Wendee Beavers.  History of Present Illness:   Brenda Butler has a hx of vasovagal syncope, MVP, MR, DM2 that was admitted after ER presentation with unwitnessed syncopal episode.  She has had a virus and N & V.  She also had syncope after she sat up with EMS. She was out a couple of seconds.  She has had 3 episodes of syncope in 18 months and while she states this is similar it is much worse than they have been.  These episodes occur when she is having a BM, almost done and then she becomes lightheaded lies down on floor and passes out for 1-2 min per her family.  She is a little disoriented when she comes to but clears.   She has had since a teen ager.  But this is most freq. episodes.    Initial EKG with SR and no acute changes from 07/2015 except slower rate.  Last 3 EKGs reviewed personally personally.  All similar to first one.   During early AM she went into SVT at rate of 170.    Labs troponin negative X 3 Lytes with Cr 0.72, glucose at 130, Ca+ 8.5 CBC WNL with Hgb 13.1 and WBC 4.8.   DDimer 0.40 U/A with large leukocytes. Nitrites neg.  CXR negative for cardiopulmonary disease.  Currently no chest pain, she had some pressure in ER but no EKG changes and troponins neg.  She is quite active normally has [place] she walks and keeps up. No chest pain with this.  She has had cold with fever for a week.  Taking coricidin HBP and delsyn but no pseudoephedrine that she knows of.   Non productive cough.  Now with fever of 102.9.     Past Medical History:    Diagnosis Date  . Diabetes mellitus without complication ([place])   . Mitral regurgitation   . Mitral valve prolapse   . Syncope and collapse   . Vaso vagal episode    chronic    History reviewed. No pertinent surgical history.   OUTPATIENT MEDICATIONS: Inpatient Medications: Scheduled Meds: . cholecalciferol  2,000 Units Oral Daily  . enoxaparin (LOVENOX) injection  40 mg Subcutaneous Daily  . ferrous sulfate  325 mg Oral Q breakfast  . insulin aspart  0-9 Units Subcutaneous TID WC  . lactobacillus acidophilus  2 tablet Oral TID  . metoprolol succinate  25 mg Oral Daily   Continuous Infusions: . sodium chloride 125 mL/hr at 10/04/16 1029  . cefTRIAXone (ROCEPHIN)  IV Stopped (10/04/16 0232)   PRN Meds: acetaminophen **OR** acetaminophen, doxepin, metoCLOPramide (REGLAN) injection, promethazine  Allergies:    Allergies  Allergen Reactions  . Codeine     Nausea and vomiting  . Colchicine     Lightheadedness  . Iron     nausea  . Metformin And Related Diarrhea  . Penicillins     Anaphylaxis and Nausea and vomiting    Social History:   Social History   Social History  . Marital status: Single  Spouse name: N/A  . Number of children: N/A  . Years of education: N/A   Occupational History  . Not on file.   Social History Main Topics  . Smoking status: Never Smoker  . Smokeless tobacco: Never Used  . Alcohol use No  . Drug use: No  . Sexual activity: Not on file   Other Topics Concern  . Not on file   Social History Narrative  . No narrative on file    Family History:   The patient's family history includes Arrhythmia in her mother; Breast cancer in her sister; CVA in her father.  ROS:    General:+ colds + fevers, no weight changes Skin:no rashes or ulcers HEENT:no blurred vision, no congestion CV:see HPI PUL:see HPI GI:no diarrhea constipation or melena, no indigestion GU:no hematuria, no dysuria MS:+ lt hip pain- arthritis, no  claudication Neuro:+ syncope, no lightheadedness Endo:+ diabetes, no thyroid disease     Physical Exam/Data:   Vitals:   10/03/16 2330 10/03/16 2345 10/04/16 0014 10/04/16 0927  BP: (!) 167/86 (!) 156/65 (!) 167/75 (!) 141/86  Pulse:   83 (!) 150  Resp: 20 18 18    Temp:   99.6 F (37.6 C) (!) 102.9 F (39.4 C)  TempSrc:   Oral Oral  SpO2:   93%   Weight:   167 lb (75.8 kg)   Height:   5\' 4"  (1.626 m)     Intake/Output Summary (Last 24 hours) at 10/04/16 1134 Last data filed at 10/03/16 2300  Gross per 24 hour  Intake             1000 ml  Output                0 ml  Net             1000 ml   Filed Weights   10/03/16 1718 10/04/16 0014  Weight: 170 lb (77.1 kg) 167 lb (75.8 kg)   Body mass index is 28.67 kg/m.  General:  Well nourished, well developed, in no acute distress, but with fever - flushed and diaphoretic HEENT: normal Lymph: no adenopathy Neck: no JVD Endocrine:  No thryomegaly Vascular: No carotid bruits;2+ pedal pulses Cardiac:  Somewhat irregular;  no murmur, gallup or rub  Lungs:  clear to auscultation bilaterally, no wheezing, rhonchi or rales  Abd: soft, nontender, no hepatomegaly  Ext: no edema Musculoskeletal:  No deformities, BUE and BLE strength normal and equal Skin: warm to hot and moist. flushed Neuro:  A &O X 3 MAE follows commands. no focal abnormalities noted Psych:  Normal affect     Relevant CV Studies: ECHO is pending  Laboratory Data:  Chemistry  Recent Labs Lab 10/03/16 1920 10/04/16 0625  NA 136 139  K 3.9 3.7  CL 105 107  CO2 24 23  GLUCOSE 160* 130*  BUN 15 10  CREATININE 0.61 0.72  CALCIUM 8.6* 8.5*  GFRNONAA >60 >60  GFRAA >60 >60  ANIONGAP 7 9     Recent Labs Lab 10/03/16 1920  PROT 6.6  ALBUMIN 3.9  AST 38  ALT 46  ALKPHOS 60  BILITOT 0.5   Hematology  Recent Labs Lab 10/03/16 1920 10/04/16 0625  WBC 6.2 4.8  RBC 4.83 4.56  HGB 13.9 13.1  HCT 42.9 40.7  MCV 88.8 89.3  MCH 28.8 28.7   MCHC 32.4 32.2  RDW 13.0 13.1  PLT 185 181   Cardiac Enzymes  Recent Labs Lab 10/04/16 0109 10/04/16  5726  TROPONINI <0.03 <0.03     Recent Labs Lab 10/03/16 1930  TROPIPOC 0.00    BNPNo results for input(s): BNP, PROBNP in the last 168 hours.  DDimer   Recent Labs Lab 10/03/16 1925  DDIMER 0.40    Radiology/Studies:  Dg Chest 2 View  Result Date: 10/03/2016 CLINICAL DATA:  Cough and fever EXAM: CHEST  2 VIEW COMPARISON:  07/27/2015 FINDINGS: The heart size and mediastinal contours are within normal limits. Both lungs are clear. The visualized skeletal structures are unremarkable. IMPRESSION: No active cardiopulmonary disease. Electronically Signed   By: Inez Catalina M.D.   On: 10/03/2016 18:16    Assessment and Plan:    Principal Problem:   Syncope Active Problems:   Heart palpitations   Anemia, iron deficiency   Syncope, vasovagal   Chest pain   1. SVT in pt with fever 102.9 this AM and now multifocal atrial tach, and viral syndrome with normal WBC.  2. Syncope hx vasovagal syncope, was not orthostatic in ER.  3. Hx palpitations have always been PACs   4. Anemia - hx iron def anemia   5. Hx of MR per record.   Signed, Cecilie Kicks, NP  10/04/2016 11:34 AM As above; pt seen and examined; Briefly she is a 71 year old female with past medical history of diabetes mellitus for evaluation of syncope. Patient has had intermittent syncope for years. Her episodes typically occur when she has a viral illness and there is associated nausea. Her most recent episode occurred with an ongoing upper respiratory infection. She went to have a bowel movement and then developed nausea followed by diaphoresis, dizziness. She lay down on the floor and was unconscious for approximately 30 seconds to 1 minute by report. This is similar to her previous episodes. She denies preceding chest pain, dyspnea or palpitations. She otherwise does not have dyspnea on exertion, orthopnea,  PND, pedal edema or exertional chest pain. Cardiology is now asked to evaluate. Note she was seen by Dr. Tamala Julian some years ago and had ectopy noted and metoprolol initiated. On telemetry she is having what appears to be paroxysmal atrial tachycardia.  Electrocardiogram shows sinus rhythm with no ST changes. Troponins are normal.   1 syncope-symptoms are clearly vagally mediated. I stressed the importance of staying hydrated. Each episode has also occurred in the setting of a viral illness. Echocardiogram has been ordered to assess LV function.  2 paroxysmal atrial tachycardia-noted on telemetry. This appears to be long-standing and may be exacerbated by her viral illness. Increase Toprol to 50 mg daily.  3 hypertension-blood pressure is elevated. Increase Toprol to 50 mg daily and follow.  4 viral illness-management per primary care.  Kirk Ruths, MD

## 2016-10-04 NOTE — Progress Notes (Signed)
Pt's temp spiked 102.9, BP 141/86, HR Afib low 100's. MD notified. Blood cultures ordered.

## 2016-10-04 NOTE — Progress Notes (Signed)
PROGRESS NOTE    Brenda Butler  ZWC:[id number] DOB: 1945/07/30 DOA: 10/03/2016 PCP: [name], MD   Brief Narrative:  71 y.o. female with medical history significant for vasovagal syncope, heart palpitations, who presented to the ED for complaints of passing out, 1 episode today while she was having a bowel movement- which was most likely lose. She gently lowered herself to the floor, says she was out for about 1 minute. Also had 2 episodes of vomiting today.  The patient has been diagnosed with syncope of unknown etiology.  Assessment & Plan:   Principal Problem:   Syncope - I suspect that there is a vasovagal component. But with this new onset of atrial fibrillation I'm wondering whether this is contributing and making her symptoms worse. Of note patient also complains of sinus infection and was found to have a UTI.  Atrial fibrillation - consulted cardiology  UTI - Awaiting urine culture results although was obtained from bed pan as such do not suspect it will be accurate. We'll continue with Rocephin at this point.  Active Problems:   Anemia, iron deficiency - Stable continue to monitor   DVT prophylaxis: Lovenox Code Status: Full Family Communication: d/c patient Disposition Plan: pending work up   Consultants:   Cardiology   Procedures: none   Antimicrobials: Rocephin   Subjective: Pt has many questions answered to her satisfaction  Objective: Vitals:   10/04/16 0014 10/04/16 0927 10/04/16 1200 10/04/16 1725  BP: (!) 167/75 (!) 141/86 135/60   Pulse: 83 (!) 150 78   Resp: 18  18   Temp: 99.6 F (37.6 C) (!) 102.9 F (39.4 C) 99.6 F (37.6 C) 100.2 F (37.9 C)  TempSrc: Oral Oral Oral Axillary  SpO2: 93%  98%   Weight: 75.8 kg (167 lb)     Height: 5\' 4"  (1.626 m)       Intake/Output Summary (Last 24 hours) at 10/04/16 1744 Last data filed at 10/03/16 2300  Gross per 24 hour  Intake             1000 ml  Output                0 ml  Net              1000 ml   Filed Weights   10/03/16 1718 10/04/16 0014  Weight: 77.1 kg (170 lb) 75.8 kg (167 lb)    Examination:  General exam: Appears calm and comfortable, in nad. Respiratory system: Clear to auscultation. Respiratory effort normal. Cardiovascular system: S1 & S2 heard, RRR.  Gastrointestinal system: Abdomen is nondistended, soft and nontender.  Central nervous system: Alert and oriented. No focal neurological deficits. Extremities: Symmetric 5 x 5 power. Skin: No rashes, lesions or ulcers, on limited exam. Psychiatry:  Mood & affect appropriate.   Data Reviewed: I have personally reviewed following labs and imaging studies  CBC:  Recent Labs Lab 10/03/16 1920 10/04/16 0625  WBC 6.2 4.8  NEUTROABS 5.3  --   HGB 13.9 13.1  HCT 42.9 40.7  MCV 88.8 89.3  PLT 185 235   Basic Metabolic Panel:  Recent Labs Lab 10/03/16 1920 10/04/16 0625  NA 136 139  K 3.9 3.7  CL 105 107  CO2 24 23  GLUCOSE 160* 130*  BUN 15 10  CREATININE 0.61 0.72  CALCIUM 8.6* 8.5*   GFR: Estimated Creatinine Clearance: 65.2 mL/min (by C-G formula based on SCr of 0.72 mg/dL). Liver Function Tests:  Recent Labs  Lab 10/03/16 1920  AST 38  ALT 46  ALKPHOS 60  BILITOT 0.5  PROT 6.6  ALBUMIN 3.9   No results for input(s): LIPASE, AMYLASE in the last 168 hours. No results for input(s): AMMONIA in the last 168 hours. Coagulation Profile: No results for input(s): INR, PROTIME in the last 168 hours. Cardiac Enzymes:  Recent Labs Lab 10/04/16 0109 10/04/16 0625 10/04/16 1120  TROPONINI <0.03 <0.03 <0.03   BNP (last 3 results) No results for input(s): PROBNP in the last 8760 hours. HbA1C: No results for input(s): HGBA1C in the last 72 hours. CBG:  Recent Labs Lab 10/04/16 0024 10/04/16 0606 10/04/16 1139 10/04/16 1640  GLUCAP 182* 119* 130* 121*   Lipid Profile: No results for input(s): CHOL, HDL, LDLCALC, TRIG, CHOLHDL, LDLDIRECT in the last 72 hours. Thyroid  Function Tests: No results for input(s): TSH, T4TOTAL, FREET4, T3FREE, THYROIDAB in the last 72 hours. Anemia Panel: No results for input(s): VITAMINB12, FOLATE, FERRITIN, TIBC, IRON, RETICCTPCT in the last 72 hours. Sepsis Labs:  Recent Labs Lab 10/03/16 1932  LATICACIDVEN 0.88    No results found for this or any previous visit (from the past 240 hour(s)).       Radiology Studies: Dg Chest 2 View  Result Date: 10/03/2016 CLINICAL DATA:  Cough and fever EXAM: CHEST  2 VIEW COMPARISON:  07/27/2015 FINDINGS: The heart size and mediastinal contours are within normal limits. Both lungs are clear. The visualized skeletal structures are unremarkable. IMPRESSION: No active cardiopulmonary disease. Electronically Signed   By: Inez Catalina M.D.   On: 10/03/2016 18:16        Scheduled Meds: . cholecalciferol  2,000 Units Oral Daily  . enoxaparin (LOVENOX) injection  40 mg Subcutaneous Daily  . ferrous sulfate  325 mg Oral Q breakfast  . insulin aspart  0-9 Units Subcutaneous TID WC  . lactobacillus acidophilus  2 tablet Oral TID  . [START ON 10/05/2016] metoprolol succinate  50 mg Oral Daily   Continuous Infusions: . cefTRIAXone (ROCEPHIN)  IV Stopped (10/04/16 0232)     LOS: 0 days    Time spent: > 35 minutes  Velvet Bathe, MD [facility] Pager [phone]  If 7PM-7AM, please contact night-coverage [email or web address] Password [facility] 10/04/2016, 5:44 PM

## 2016-10-05 ENCOUNTER — Inpatient Hospital Stay (HOSPITAL_COMMUNITY): Payer: Medicare Other

## 2016-10-05 DIAGNOSIS — I340 Nonrheumatic mitral (valve) insufficiency: Secondary | ICD-10-CM

## 2016-10-05 DIAGNOSIS — I480 Paroxysmal atrial fibrillation: Secondary | ICD-10-CM

## 2016-10-05 LAB — ECHOCARDIOGRAM COMPLETE
Height: 64 in
Weight: 2672 oz

## 2016-10-05 LAB — GLUCOSE, CAPILLARY
Glucose-Capillary: 125 mg/dL — ABNORMAL HIGH (ref 65–99)
Glucose-Capillary: 102 mg/dL — ABNORMAL HIGH (ref 65–99)
Glucose-Capillary: 119 mg/dL — ABNORMAL HIGH (ref 65–99)
Glucose-Capillary: 112 mg/dL — ABNORMAL HIGH (ref 65–99)

## 2016-10-05 MED ORDER — APIXABAN 5 MG PO TABS
5.0000 mg | ORAL_TABLET | Freq: Two times a day (BID) | ORAL | Status: DC
  Administered 2016-10-05 – 2016-10-06 (×3): 5 mg via ORAL
  Filled 2016-10-05 (×3): qty 1

## 2016-10-05 MED ORDER — DEXTROSE 5 % IV SOLN
1.0000 g | Freq: Every day | INTRAVENOUS | Status: DC
  Administered 2016-10-05: 1 g via INTRAVENOUS
  Filled 2016-10-05 (×2): qty 10

## 2016-10-05 MED ORDER — METOPROLOL SUCCINATE ER 50 MG PO TB24
50.0000 mg | ORAL_TABLET | Freq: Every day | ORAL | Status: DC
  Administered 2016-10-05: 50 mg via ORAL
  Filled 2016-10-05: qty 1

## 2016-10-05 MED ORDER — METOPROLOL SUCCINATE ER 50 MG PO TB24
75.0000 mg | ORAL_TABLET | Freq: Every day | ORAL | Status: DC
  Administered 2016-10-06: 75 mg via ORAL
  Filled 2016-10-05: qty 1

## 2016-10-05 NOTE — Progress Notes (Signed)
Notiff Dr. Olevia Bowens of [facility] -Lopressor 50 mg po ordered.

## 2016-10-05 NOTE — Discharge Instructions (Signed)

## 2016-10-05 NOTE — Progress Notes (Signed)
Progress Note  Patient Name: Brenda Butler Date of Encounter: 10/05/2016  Primary Cardiologist: Dr Stanford Breed  Subjective   Pt denies CP or dyspnea; fever improving this AM  Inpatient Medications    Scheduled Meds: . cholecalciferol  2,000 Units Oral Daily  . enoxaparin (LOVENOX) injection  40 mg Subcutaneous Daily  . famotidine  20 mg Oral BID  . ferrous sulfate  325 mg Oral Q breakfast  . insulin aspart  0-9 Units Subcutaneous TID WC  . lactobacillus acidophilus  2 tablet Oral TID  . metoprolol succinate  50 mg Oral Daily   Continuous Infusions: . cefTRIAXone (ROCEPHIN)  IV Stopped (10/04/16 2314)   PRN Meds: acetaminophen **OR** acetaminophen, doxepin, ibuprofen, metoCLOPramide (REGLAN) injection, promethazine   Vital Signs    Vitals:   10/04/16 1916 10/04/16 2147 10/05/16 0515 10/05/16 0709  BP:   135/82   Pulse:   91 (!) 140  Resp:   18   Temp: 99.7 F (37.6 C) (!) 102.5 F (39.2 C) 98.9 F (37.2 C)   TempSrc: Oral Oral Oral   SpO2:   95%   Weight:      Height:        Intake/Output Summary (Last 24 hours) at 10/05/16 0817 Last data filed at 10/04/16 1200  Gross per 24 hour  Intake              240 ml  Output                0 ml  Net              240 ml   Filed Weights   10/03/16 1718 10/04/16 0014  Weight: 77.1 kg (170 lb) 75.8 kg (167 lb)    Telemetry    Sinus with PAF- Personally Reviewed  Physical Exam   GEN: No acute distress.   Neck: Supple Cardiac: RRR Respiratory: CTA GI: Soft, nontender, non-distended, no masses MS: No edema Neuro:  Grossly intact Psych: Normal   Labs    Chemistry Recent Labs Lab 10/03/16 1920 10/04/16 0625  NA 136 139  K 3.9 3.7  CL 105 107  CO2 24 23  GLUCOSE 160* 130*  BUN 15 10  CREATININE 0.61 0.72  CALCIUM 8.6* 8.5*  PROT 6.6  --   ALBUMIN 3.9  --   AST 38  --   ALT 46  --   ALKPHOS 60  --   BILITOT 0.5  --   GFRNONAA >60 >60  GFRAA >60 >60  ANIONGAP 7 9     Hematology Recent  Labs Lab 10/03/16 1920 10/04/16 0625  WBC 6.2 4.8  RBC 4.83 4.56  HGB 13.9 13.1  HCT 42.9 40.7  MCV 88.8 89.3  MCH 28.8 28.7  MCHC 32.4 32.2  RDW 13.0 13.1  PLT 185 181    Cardiac Enzymes Recent Labs Lab 10/04/16 0109 10/04/16 0625 10/04/16 1120  TROPONINI <0.03 <0.03 <0.03    Recent Labs Lab 10/03/16 1930  TROPIPOC 0.00      DDimer  Recent Labs Lab 10/03/16 1925  DDIMER 0.40     Radiology    Dg Chest 2 View  Result Date: 10/03/2016 CLINICAL DATA:  Cough and fever EXAM: CHEST  2 VIEW COMPARISON:  07/27/2015 FINDINGS: The heart size and mediastinal contours are within normal limits. Both lungs are clear. The visualized skeletal structures are unremarkable. IMPRESSION: No active cardiopulmonary disease. Electronically Signed   By: Inez Catalina M.D.   On: 10/03/2016  18:16     Patient Profile     71 year old female with past medical history of diabetes mellitus for evaluation of syncope. Felt to be vasovagal. Also with PAT and PAF on telemetry.  Assessment & Plan    1 syncope-symptoms felt to be vagally mediated. Each episode occurred in the setting of a viral illness and bowel movement. Pt instructed previously on importance of staying hydrated. Echocardiogram has been ordered to assess LV function.  2 paroxysmal atrial tachycardia and PAF-atrial tachycardia noted on telemetry previously and PAF over last 24 hours. Increase Toprol to 75 mg daily. CHADSvasc 3. Add apixaban 5 mg BID; check echo and TSH.  3 hypertension-blood pressure is improved; continue present meds and follow.  4 viral illness-management per primary care.  Signed, Kirk Ruths, MD  10/05/2016, 8:17 AM

## 2016-10-05 NOTE — Progress Notes (Addendum)
PROGRESS NOTE    Brenda Butler  JQZ:[id number] DOB: October 18, 1945 DOA: 10/03/2016 PCP: [name], MD   Brief Narrative:  71 y.o. female with medical history significant for vasovagal syncope, heart palpitations, who presented to the ED for complaints of passing out, 1 episode today while she was having a bowel movement- which was most likely lose. She gently lowered herself to the floor, says she was out for about 1 minute. Also had 2 episodes of vomiting today.  The patient has been diagnosed with syncope of unknown etiology.  Assessment & Plan:   Principal Problem:   Syncope - I suspect that there is a vasovagal component. Addendum: probably compounded by sinus and urinary  Infections  Atrial fibrillation - consulted cardiology. New problem pt started on Toprol-XL and Eliquis  UTI - Urine cx has grown out E coli - Awaiting sensitivities   Active Problems:   Anemia, iron deficiency - Stable continue to monitor   DVT prophylaxis: Lovenox Code Status: Full Family Communication: d/c patient Disposition Plan: pending work up   Consultants:   Cardiology   Procedures: none   Antimicrobials: Rocephin   Subjective: Pt has no new complaints.  Objective: Vitals:   10/05/16 0515 10/05/16 0709 10/05/16 1120 10/05/16 1240  BP: 135/82   (!) 132/51  Pulse: 91 (!) 140  75  Resp: 18   18  Temp: 98.9 F (37.2 C)  99.7 F (37.6 C) 99 F (37.2 C)  TempSrc: Oral  Oral Oral  SpO2: 95%   92%  Weight:      Height:        Intake/Output Summary (Last 24 hours) at 10/05/16 1707 Last data filed at 10/05/16 1600  Gross per 24 hour  Intake              630 ml  Output                0 ml  Net              630 ml   Filed Weights   10/03/16 1718 10/04/16 0014  Weight: 77.1 kg (170 lb) 75.8 kg (167 lb)    Examination:  General exam: Appears calm and comfortable, in nad. Respiratory system: Clear to auscultation. Respiratory effort normal. Cardiovascular system: S1  & S2 heard, RRR.  Gastrointestinal system: Abdomen is nondistended, soft and nontender.  Central nervous system: Alert and oriented. No focal neurological deficits. Extremities: Symmetric 5 x 5 power. Skin: No rashes, lesions or ulcers, on limited exam. Psychiatry:  Mood & affect appropriate.   Data Reviewed: I have personally reviewed following labs and imaging studies  CBC:  Recent Labs Lab 10/03/16 1920 10/04/16 0625  WBC 6.2 4.8  NEUTROABS 5.3  --   HGB 13.9 13.1  HCT 42.9 40.7  MCV 88.8 89.3  PLT 185 622   Basic Metabolic Panel:  Recent Labs Lab 10/03/16 1920 10/04/16 0625  NA 136 139  K 3.9 3.7  CL 105 107  CO2 24 23  GLUCOSE 160* 130*  BUN 15 10  CREATININE 0.61 0.72  CALCIUM 8.6* 8.5*   GFR: Estimated Creatinine Clearance: 65.2 mL/min (by C-G formula based on SCr of 0.72 mg/dL). Liver Function Tests:  Recent Labs Lab 10/03/16 1920  AST 38  ALT 46  ALKPHOS 60  BILITOT 0.5  PROT 6.6  ALBUMIN 3.9   No results for input(s): LIPASE, AMYLASE in the last 168 hours. No results for input(s): AMMONIA in the last 168 hours.  Coagulation Profile: No results for input(s): INR, PROTIME in the last 168 hours. Cardiac Enzymes:  Recent Labs Lab 10/04/16 0109 10/04/16 0625 10/04/16 1120  TROPONINI <0.03 <0.03 <0.03   BNP (last 3 results) No results for input(s): PROBNP in the last 8760 hours. HbA1C: No results for input(s): HGBA1C in the last 72 hours. CBG:  Recent Labs Lab 10/04/16 1640 10/04/16 2137 10/05/16 0602 10/05/16 1111 10/05/16 1624  GLUCAP 121* 109* 125* 102* 119*   Lipid Profile: No results for input(s): CHOL, HDL, LDLCALC, TRIG, CHOLHDL, LDLDIRECT in the last 72 hours. Thyroid Function Tests: No results for input(s): TSH, T4TOTAL, FREET4, T3FREE, THYROIDAB in the last 72 hours. Anemia Panel: No results for input(s): VITAMINB12, FOLATE, FERRITIN, TIBC, IRON, RETICCTPCT in the last 72 hours. Sepsis Labs:  Recent Labs Lab  10/03/16 1932  LATICACIDVEN 0.88    Recent Results (from the past 240 hour(s))  Culture, Urine     Status: Abnormal (Preliminary result)   Collection Time: 10/03/16  7:30 PM  Result Value Ref Range Status   Specimen Description URINE, RANDOM  Final   Special Requests NONE  Final   Culture (A)  Final    >=100,000 COLONIES/mL ESCHERICHIA COLI SUSCEPTIBILITIES TO FOLLOW    Report Status PENDING  Incomplete  Culture, blood (routine x 2)     Status: None (Preliminary result)   Collection Time: 10/04/16  9:53 AM  Result Value Ref Range Status   Specimen Description BLOOD RIGHT ARM  Final   Special Requests   Final    BOTTLES DRAWN AEROBIC AND ANAEROBIC Blood Culture adequate volume   Culture NO GROWTH 1 DAY  Final   Report Status PENDING  Incomplete  Culture, blood (routine x 2)     Status: None (Preliminary result)   Collection Time: 10/04/16  9:58 AM  Result Value Ref Range Status   Specimen Description BLOOD RIGHT HAND  Final   Special Requests   Final    BOTTLES DRAWN AEROBIC AND ANAEROBIC Blood Culture adequate volume   Culture NO GROWTH 1 DAY  Final   Report Status PENDING  Incomplete      Radiology Studies: Dg Chest 2 View  Result Date: 10/03/2016 CLINICAL DATA:  Cough and fever EXAM: CHEST  2 VIEW COMPARISON:  07/27/2015 FINDINGS: The heart size and mediastinal contours are within normal limits. Both lungs are clear. The visualized skeletal structures are unremarkable. IMPRESSION: No active cardiopulmonary disease. Electronically Signed   By: Inez Catalina M.D.   On: 10/03/2016 18:16    Scheduled Meds: . apixaban  5 mg Oral BID  . cholecalciferol  2,000 Units Oral Daily  . famotidine  20 mg Oral BID  . ferrous sulfate  325 mg Oral Q breakfast  . insulin aspart  0-9 Units Subcutaneous TID WC  . lactobacillus acidophilus  2 tablet Oral TID  . [START ON 10/06/2016] metoprolol succinate  75 mg Oral Daily   Continuous Infusions: . cefTRIAXone (ROCEPHIN)  IV 1 g (10/05/16  1540)     LOS: 1 day    Time spent: > 35 minutes  Velvet Bathe, MD [facility] Pager [phone]  If 7PM-7AM, please contact night-coverage [email or web address] Password [email or web address] 10/05/2016, 5:07 PM

## 2016-10-05 NOTE — Progress Notes (Signed)
Echocardiogram 2D Echocardiogram has been performed.  Brenda Butler 10/05/2016, 9:33 AM

## 2016-10-06 DIAGNOSIS — I471 Supraventricular tachycardia: Secondary | ICD-10-CM

## 2016-10-06 DIAGNOSIS — I4719 Other supraventricular tachycardia: Secondary | ICD-10-CM

## 2016-10-06 DIAGNOSIS — I480 Paroxysmal atrial fibrillation: Secondary | ICD-10-CM

## 2016-10-06 DIAGNOSIS — Z7901 Long term (current) use of anticoagulants: Secondary | ICD-10-CM

## 2016-10-06 LAB — GLUCOSE, CAPILLARY: GLUCOSE-CAPILLARY: 117 mg/dL — AB (ref 65–99)

## 2016-10-06 LAB — URINE CULTURE

## 2016-10-06 LAB — TSH: TSH: 3.115 u[IU]/mL (ref 0.350–4.500)

## 2016-10-06 MED ORDER — METOPROLOL SUCCINATE ER 25 MG PO TB24
75.0000 mg | ORAL_TABLET | Freq: Every day | ORAL | 0 refills | Status: DC
Start: 2016-10-06 — End: 2016-10-28

## 2016-10-06 MED ORDER — APIXABAN 5 MG PO TABS: 5.0000 mg | ORAL_TABLET | Freq: Two times a day (BID) | ORAL | 0 refills | Status: DC

## 2016-10-06 NOTE — Progress Notes (Signed)
Order received to discharge patient.  Telemetry monitor removed and CCMD notified.  PIV access removed.  Discharge instructions, follow up, medications and instructions for their use were discussed with patient. 

## 2016-10-06 NOTE — Progress Notes (Signed)
Progress Note  Patient Name: Brenda Butler Date of Encounter: 10/06/2016  Primary Cardiologist: Tamala Julian  Subjective   Admitted for febrile illness and cardiology consult and due to recurrent syncope. Vagal mediation appears most likely cause. Instructed concerning episodes/prodrome to avoid injury and recurrent syncope.  Inpatient Medications    Scheduled Meds: . apixaban  5 mg Oral BID  . cholecalciferol  2,000 Units Oral Daily  . famotidine  20 mg Oral BID  . ferrous sulfate  325 mg Oral Q breakfast  . insulin aspart  0-9 Units Subcutaneous TID WC  . lactobacillus acidophilus  2 tablet Oral TID  . metoprolol succinate  75 mg Oral Daily   Continuous Infusions: . cefTRIAXone (ROCEPHIN)  IV Stopped (10/05/16 1610)   PRN Meds: acetaminophen **OR** acetaminophen, doxepin, ibuprofen, metoCLOPramide (REGLAN) injection, promethazine   Vital Signs    Vitals:   10/05/16 1723 10/05/16 2106 10/06/16 0346 10/06/16 0655  BP:  (!) 149/61 139/69   Pulse:  81 73   Resp:  18 18   Temp: 99.9 F (37.7 C) (!) 101.6 F (38.7 C) (!) 100.5 F (38.1 C) 99.3 F (37.4 C)  TempSrc: Oral Oral Oral Oral  SpO2:  92% 91%   Weight:      Height:        Intake/Output Summary (Last 24 hours) at 10/06/16 0921 Last data filed at 10/05/16 1808  Gross per 24 hour  Intake              630 ml  Output                0 ml  Net              630 ml   Filed Weights   10/03/16 1718 10/04/16 0014  Weight: 170 lb (77.1 kg) 167 lb (75.8 kg)    Telemetry    Normal sinus rhythm with brief episodes of MAT and atrial fibrillation - Personally Reviewed  ECG    On 10/03/2016, normal-appearing EKG was normal sinus rhythm - Personally Reviewed  Physical Exam  Appears healthy GEN: No acute distress. Speech has nasal quality/congestion.   Neck: No JVD Cardiac: RRR, no murmurs, rubs, or gallops.  Respiratory: Clear to auscultation bilaterally. GI: Soft, nontender, non-distended  MS: No edema; No  deformity. Neuro:  Nonfocal  Psych: Normal affect   Labs    Chemistry Recent Labs Lab 10/03/16 1920 10/04/16 0625  NA 136 139  K 3.9 3.7  CL 105 107  CO2 24 23  GLUCOSE 160* 130*  BUN 15 10  CREATININE 0.61 0.72  CALCIUM 8.6* 8.5*  PROT 6.6  --   ALBUMIN 3.9  --   AST 38  --   ALT 46  --   ALKPHOS 60  --   BILITOT 0.5  --   GFRNONAA >60 >60  GFRAA >60 >60  ANIONGAP 7 9     Hematology Recent Labs Lab 10/03/16 1920 10/04/16 0625  WBC 6.2 4.8  RBC 4.83 4.56  HGB 13.9 13.1  HCT 42.9 40.7  MCV 88.8 89.3  MCH 28.8 28.7  MCHC 32.4 32.2  RDW 13.0 13.1  PLT 185 181    Cardiac Enzymes Recent Labs Lab 10/04/16 0109 10/04/16 0625 10/04/16 1120  TROPONINI <0.03 <0.03 <0.03    Recent Labs Lab 10/03/16 1930  TROPIPOC 0.00     BNPNo results for input(s): BNP, PROBNP in the last 168 hours.   DDimer  Recent Labs Lab 10/03/16  1925  DDIMER 0.40     Radiology    No results found.  Cardiac Studies   Echocardiogram 10/05/16: Study Conclusions  - Left ventricle: The cavity size was normal. Wall thickness was   normal. Systolic function was vigorous. The estimated ejection   fraction was in the range of 65% to 70%. Wall motion was normal;   there were no regional wall motion abnormalities. Doppler   parameters are consistent with abnormal left ventricular   relaxation (grade 1 diastolic dysfunction). - Mitral valve: There was mild regurgitation. - Pulmonary arteries: Systolic pressure was moderately increased.  Impressions:  - Normal LV systolic function; mild diastolic dysfunction; mild MR;   mild TR with moderately elevated pulmonary pressure.   Patient Profile     71 y.o. female with past medical history of diabetes mellitus for evaluation of syncope. Felt to be vasovagal. Also with PAT and PAF on telemetry.   Assessment & Plan    1. Recurrent neurally-mediated syncope, usually in the setting of febrile illness. Prodrome is GI with  nausea and diaphoresis. 2. Paroxysmal atrial fibrillation as well as PAT/MAT. Anticoagulation was started because of CHADS VASC >/= 2. With history of recurrent syncope, she is at risk for injury and bleeding on anticoagulation. 3. Blood pressure is improved.  Ultimately, I was suggest an outpatient electrophysiology consultation concerning recurrent syncope in this female whose management strategy has significantly become more complicated because of the identification of atrial fibrillation requiring anticoagulation. Would pacemaker therapy provide protection against recurrent syncope?  Signed, Sinclair Grooms, MD  10/06/2016, 9:21 AM

## 2016-10-06 NOTE — Discharge Summary (Signed)
Physician Discharge Summary  Brenda Butler [id number]:[id number] DOB: [birth year] DOA: 10/03/2016  PCP: [name], MD  Admit date: 10/03/2016 Discharge date: 10/06/2016  Time spent: > 35 minutes  Recommendations for Outpatient Follow-up:  Ensure patient follows up with cardiology or primary care physician for management of new onset A. fib  Discharge Diagnoses:  Principal Problem:   Syncope Active Problems:   Heart palpitations   Anemia, iron deficiency   Syncope, vasovagal   Chest pain   Paroxysmal atrial fibrillation (HCC)   PAT (paroxysmal atrial tachycardia) (HCC)   Chronic anticoagulation   Discharge Condition: Stable  Diet recommendation: Carb modified  Filed Weights   10/03/16 1718 10/04/16 0014  Weight: 77.1 kg (170 lb) 75.8 kg (167 lb)    History of present illness:  Brenda Butler medical history significant for vasovagal syncope, heart palpitations, who presented to the ED for complaints of passing out. Was diagnosed with syncope secondary to vasovagal etiology, new onset A. fib, UTI, and URI  Hospital Course:  Syncope - Vaso Vagal mediated and resolved after IV fluid rehydration  New onset atrial fibrillation -Consulted cardiology who placed patient on beta blocker and eliquis for anticoagulation  URI -Most likely viral etiology and resolving  UTI - Question whether this diagnosis is accurate given the patient had a urine culture which had been contaminated with fecal matter. Patient denied any symptoms and was treated for 3 days. No further antibiotics on discharge  Procedures:  None  Consultations:  Cardiology  Discharge Exam: Vitals:   10/06/16 0346 10/06/16 0655  BP: 139/69   Pulse: 73   Resp: 18   Temp: (!) 100.5 F (38.1 C) 99.3 F (37.4 C)    General: Patient in no acute distress, alert and awake Cardiovascular: Irregularly irregular, no murmurs rubs Respiratory: No increased work of breathing, no wheezes  Discharge  Instructions   Discharge Instructions    Call MD for:  severe uncontrolled pain    Complete by:  As directed    Call MD for:  temperature >100.4    Complete by:  As directed    Diet - low sodium heart healthy    Complete by:  As directed    Discharge instructions    Complete by:  As directed    Please follow up with your cardiologist for you a fib after hospital discharge. Maintain well hydrated and take your time when changing positions from laying to sitting and standing.   Increase activity slowly    Complete by:  As directed      Current Discharge Medication List    START taking these medications   Details  apixaban (ELIQUIS) 5 MG TABS tablet Take 1 tablet (5 mg total) by mouth 2 (two) times daily. Qty: 60 tablet, Refills: 0      CONTINUE these medications which have CHANGED   Details  metoprolol succinate (TOPROL-XL) 25 MG 24 hr tablet Take 3 tablets (75 mg total) by mouth daily. Qty: 30 tablet, Refills: 0      CONTINUE these medications which have NOT CHANGED   Details  acetaminophen (TYLENOL) 500 MG tablet Take 500 mg by mouth every 6 (six) hours as needed for mild pain.    benzonatate (TESSALON) 100 MG capsule Take 200 mg by mouth 3 (three) times daily as needed for cough.    Cholecalciferol (VITAMIN D) 2000 UNITS tablet Take 2,000 Units by mouth daily.    diphenoxylate-atropine (LOMOTIL) 2.5-0.025 MG tablet Take 1 tablet by mouth 4 (four) times  daily as needed for diarrhea or loose stools. Qty: 30 tablet, Refills: 0    doxepin (SINEQUAN) 10 MG capsule Take 10 mg by mouth at bedtime as needed (mood).     ferrous sulfate 325 (65 FE) MG tablet Take 325 mg by mouth daily with breakfast.    metFORMIN (GLUMETZA) 500 MG (MOD) 24 hr tablet Take 500 mg by mouth daily with breakfast.    ondansetron (ZOFRAN ODT) 4 MG disintegrating tablet Take 1 tablet (4 mg total) by mouth every 8 (eight) hours as needed for nausea. Qty: 6 tablet, Refills: 0    rosuvastatin (CRESTOR)  10 MG tablet Take 2.5 mg by mouth 3 (three) times a week.       STOP taking these medications     guaiFENesin (ROBITUSSIN) 100 MG/5ML liquid      promethazine (PHENERGAN) 25 MG tablet        Allergies  Allergen Reactions  . Codeine     Nausea and vomiting  . Colchicine     Lightheadedness  . Iron     nausea  . Metformin And Related Diarrhea  . Penicillins     Anaphylaxis and Nausea and vomiting      The results of significant diagnostics from this hospitalization (including imaging, microbiology, ancillary and laboratory) are listed below for reference.    Significant Diagnostic Studies: Dg Chest 2 View  Result Date: 10/03/2016 CLINICAL DATA:  Cough and fever EXAM: CHEST  2 VIEW COMPARISON:  07/27/2015 FINDINGS: The heart size and mediastinal contours are within normal limits. Both lungs are clear. The visualized skeletal structures are unremarkable. IMPRESSION: No active cardiopulmonary disease. Electronically Signed   By: Inez Catalina M.D.   On: 10/03/2016 18:16   Mm Screening Breast Tomo Bilateral  Result Date: 09/19/2016 CLINICAL DATA:  Screening. EXAM: 2D DIGITAL SCREENING BILATERAL MAMMOGRAM WITH CAD AND ADJUNCT TOMO COMPARISON:  Previous exam(s). ACR Breast Density Category c: The breast tissue is heterogeneously dense, which may obscure small masses. FINDINGS: There are no findings suspicious for malignancy. Images were processed with CAD. IMPRESSION: No mammographic evidence of malignancy. A result letter of this screening mammogram will be mailed directly to the patient. RECOMMENDATION: Screening mammogram in one year. (Code:SM-B-01Y) BI-RADS CATEGORY  1: Negative. Electronically Signed   By: Trude Mcburney M.D.   On: 09/19/2016 14:41    Microbiology: Recent Results (from the past 240 hour(s))  Culture, Urine     Status: Abnormal   Collection Time: 10/03/16  7:30 PM  Result Value Ref Range Status   Specimen Description URINE, RANDOM  Final   Special Requests  NONE  Final   Culture >=100,000 COLONIES/mL ESCHERICHIA COLI (A)  Final   Report Status 10/06/2016 FINAL  Final   Organism ID, Bacteria ESCHERICHIA COLI (A)  Final      Susceptibility   Escherichia coli - MIC*    AMPICILLIN 4 SENSITIVE Sensitive     CEFAZOLIN <=4 SENSITIVE Sensitive     CEFTRIAXONE <=1 SENSITIVE Sensitive     CIPROFLOXACIN <=0.25 SENSITIVE Sensitive     GENTAMICIN <=1 SENSITIVE Sensitive     IMIPENEM <=0.25 SENSITIVE Sensitive     NITROFURANTOIN <=16 SENSITIVE Sensitive     TRIMETH/SULFA <=20 SENSITIVE Sensitive     AMPICILLIN/SULBACTAM <=2 SENSITIVE Sensitive     PIP/TAZO <=4 SENSITIVE Sensitive     Extended ESBL NEGATIVE Sensitive     * >=100,000 COLONIES/mL ESCHERICHIA COLI  Culture, blood (routine x 2)     Status: None (Preliminary result)  Collection Time: 10/04/16  9:53 AM  Result Value Ref Range Status   Specimen Description BLOOD RIGHT ARM  Final   Special Requests   Final    BOTTLES DRAWN AEROBIC AND ANAEROBIC Blood Culture adequate volume   Culture NO GROWTH 1 DAY  Final   Report Status PENDING  Incomplete  Culture, blood (routine x 2)     Status: None (Preliminary result)   Collection Time: 10/04/16  9:58 AM  Result Value Ref Range Status   Specimen Description BLOOD RIGHT HAND  Final   Special Requests   Final    BOTTLES DRAWN AEROBIC AND ANAEROBIC Blood Culture adequate volume   Culture NO GROWTH 1 DAY  Final   Report Status PENDING  Incomplete     Labs: Basic Metabolic Panel:  Recent Labs Lab 10/03/16 1920 10/04/16 0625  NA 136 139  K 3.9 3.7  CL 105 107  CO2 24 23  GLUCOSE 160* 130*  BUN 15 10  CREATININE 0.61 0.72  CALCIUM 8.6* 8.5*   Liver Function Tests:  Recent Labs Lab 10/03/16 1920  AST 38  ALT 46  ALKPHOS 60  BILITOT 0.5  PROT 6.6  ALBUMIN 3.9   No results for input(s): LIPASE, AMYLASE in the last 168 hours. No results for input(s): AMMONIA in the last 168 hours. CBC:  Recent Labs Lab 10/03/16 1920  10/04/16 0625  WBC 6.2 4.8  NEUTROABS 5.3  --   HGB 13.9 13.1  HCT 42.9 40.7  MCV 88.8 89.3  PLT 185 181   Cardiac Enzymes:  Recent Labs Lab 10/04/16 0109 10/04/16 0625 10/04/16 1120  TROPONINI <0.03 <0.03 <0.03   BNP: BNP (last 3 results) No results for input(s): BNP in the last 8760 hours.  ProBNP (last 3 results) No results for input(s): PROBNP in the last 8760 hours.  CBG:  Recent Labs Lab 10/05/16 0602 10/05/16 1111 10/05/16 1624 10/05/16 2104 10/06/16 0649  GLUCAP 125* 102* 119* 112* 117*   Signed:  Velvet Bathe MD.  [facility] 10/06/2016, 10:07 AM

## 2016-10-09 LAB — CULTURE, BLOOD (ROUTINE X 2)
Culture: NO GROWTH
Culture: NO GROWTH
Special Requests: ADEQUATE
Special Requests: ADEQUATE

## 2016-10-15 DIAGNOSIS — R110 Nausea: Secondary | ICD-10-CM | POA: Diagnosis not present

## 2016-10-15 DIAGNOSIS — G522 Disorders of vagus nerve: Secondary | ICD-10-CM | POA: Diagnosis not present

## 2016-10-15 DIAGNOSIS — I4891 Unspecified atrial fibrillation: Secondary | ICD-10-CM | POA: Diagnosis not present

## 2016-10-15 DIAGNOSIS — R55 Syncope and collapse: Secondary | ICD-10-CM | POA: Diagnosis not present

## 2016-10-21 ENCOUNTER — Telehealth: Payer: Self-pay | Admitting: Student

## 2016-10-21 NOTE — Telephone Encounter (Signed)
Received records from [facility] for appointment on 10/28/16 with Bernerd Pho, [place].  Records put with Brittany's schedule for 10/28/16

## 2016-10-26 NOTE — Progress Notes (Signed)
Cardiology Office Note    Date:  10/28/2016   ID:  Brenda Butler, DOB 1945-08-15, MRN [id number]  PCP:  [name], MD  Cardiologist: Previously Dr. Tamala Julian --> Wishes to follow with Dr. Stanford Breed  Chief Complaint  Patient presents with  . Hospitalization Follow-up    History of Present Illness:    Brenda Butler is a 71 y.o. female with past medical history of MVP, vasovagal syncope, Type 2 DM, and palpitations who presents to the office today for hospital follow-up.   She was recently admitted from 5/25 - 10/06/2016 for an unwitnessed syncopal event. Reported a recent virus with associated nausea and vomiting. During admission, she was noted to have an episode of SVT along with PAT. Her PAT was a chronic issue which was likely exacerbated by her viral illness, therefore her Toprol-XL was increased from 25mg  daily to 50mg  daily. Also had episodes of PAF noted, therefore she was started on Eliquis 5mg  BID for anticoagulation. Echo showed a preserved EF of 65-70% with Grade 1 DD and mild MR and TR. Syncopal events were clearly vagally medicated, therefore the importance of hydration was stressed with the patient.   In talking with the patient today, she denies any repeat episodes of palpitations. Reports she is still trying to build up her strength from her recent hospitalization. She denies any recent chest discomfort, dyspnea on exertion, orthopnea, PND, or lower extremity edema.  She reports her PCP was concerned about her being on anticoagulation with her history of vasovagal syncope. She has experienced syncopal episodes since childhood and reports 2-3 episodes of hitting her head over this time frame. She usually has prodromal symptoms and is able to sit down prior to having her syncopal event.  She reports good compliance with Eliquis 5 mg BID and denies any evidence of active bleeding. Her Toprol-XL was reduced from 75 mg daily to 50 mg daily in the setting of significant  fatigue.   Past Medical History:  Diagnosis Date  . Diabetes mellitus without complication ([place])   . Mitral regurgitation   . Mitral valve prolapse   . Palpitations    a. known history of PAT. b. admitted for vasovagal syncope in 09/2016 and noted to have PAF --> started on anticoagulation.   . Syncope and collapse   . Vasovagal syncope    a. occuring since childhood per the patient's report.     No past surgical history on file.  Current Medications: Outpatient Medications Prior to Visit  Medication Sig Dispense Refill  . acetaminophen (TYLENOL) 500 MG tablet Take 500 mg by mouth every 6 (six) hours as needed for mild pain.    Marland Kitchen apixaban (ELIQUIS) 5 MG TABS tablet Take 1 tablet (5 mg total) by mouth 2 (two) times daily. 60 tablet 0  . Cholecalciferol (VITAMIN D) 2000 UNITS tablet Take 2,000 Units by mouth daily.    . diphenoxylate-atropine (LOMOTIL) 2.5-0.025 MG tablet Take 1 tablet by mouth 4 (four) times daily as needed for diarrhea or loose stools. 30 tablet 0  . doxepin (SINEQUAN) 10 MG capsule Take 10 mg by mouth at bedtime as needed (ITCHING).     . ferrous sulfate 325 (65 FE) MG tablet Take 325 mg by mouth daily with breakfast.    . metFORMIN (GLUCOPHAGE-XR) 500 MG 24 hr tablet Take 1 tablet by mouth daily.  1  . ondansetron (ZOFRAN ODT) 4 MG disintegrating tablet Take 1 tablet (4 mg total) by mouth every 8 (eight) hours as  needed for nausea. 6 tablet 0  . rosuvastatin (CRESTOR) 10 MG tablet Take 10 mg by mouth 3 (three) times a week.     . metoprolol succinate (TOPROL-XL) 25 MG 24 hr tablet Take 3 tablets (75 mg total) by mouth daily. (Patient taking differently: Take 50 mg by mouth daily. ) 30 tablet 0  . benzonatate (TESSALON) 100 MG capsule Take 200 mg by mouth 3 (three) times daily as needed for cough.    . metFORMIN (GLUMETZA) 500 MG (MOD) 24 hr tablet Take 500 mg by mouth daily with breakfast.     No facility-administered medications prior to visit.      Allergies:    Penicillins; Codeine; Colchicine; Iron; and Metformin and related   Social History   Social History  . Marital status: Single    Spouse name: N/A  . Number of children: N/A  . Years of education: N/A   Social History Main Topics  . Smoking status: Never Smoker  . Smokeless tobacco: Never Used  . Alcohol use No  . Drug use: No  . Sexual activity: Not Asked   Other Topics Concern  . None   Social History Narrative  . None     Family History:  The patient's family history includes Arrhythmia in her mother; Breast cancer in her sister; CVA in her father.   Review of Systems:   Please see the history of present illness.     General:  No chills, fever, night sweats or weight changes.  Cardiovascular:  No chest pain, dyspnea on exertion, edema, orthopnea, palpitations, paroxysmal nocturnal dyspnea. Positive for palpitations and syncope.  Dermatological: No rash, lesions/masses Respiratory: No cough, dyspnea Urologic: No hematuria, dysuria Abdominal:   No nausea, vomiting, diarrhea, bright red blood per rectum, melena, or hematemesis Neurologic:  No visual changes, wkns, changes in mental status. All other systems reviewed and are otherwise negative except as noted above.   Physical Exam:    VS:  BP 140/84   Pulse 66   Ht 5\' 4"  (1.626 m)   Wt 167 lb 3.2 oz (75.8 kg)   SpO2 99%   BMI 28.70 kg/m    General: Well developed, well nourished Caucasian female appearing in no acute distress. Head: Normocephalic, atraumatic, sclera non-icteric, no xanthomas, nares are without discharge.  Neck: No carotid bruits. JVD not elevated.  Lungs: Respirations regular and unlabored, without wheezes or rales.  Heart: Regular rate and rhythm. No S3 or S4.  No murmur, no rubs, or gallops appreciated. Abdomen: Soft, non-tender, non-distended with normoactive bowel sounds. No hepatomegaly. No rebound/guarding. No obvious abdominal masses. Msk:  Strength and tone appear normal for age. No joint  deformities or effusions. Extremities: No clubbing or cyanosis. No lower extremity edema.  Distal pedal pulses are 2+ bilaterally. Neuro: Alert and oriented X 3. Moves all extremities spontaneously. No focal deficits noted. Psych:  Responds to questions appropriately with a normal affect. Skin: No rashes or lesions noted  Wt Readings from Last 3 Encounters:  10/28/16 167 lb 3.2 oz (75.8 kg)  10/04/16 167 lb (75.8 kg)     Studies/Labs Reviewed:   EKG:  EKG is not ordered today.  Recent Labs: 10/03/2016: ALT 46 10/04/2016: BUN 10; Creatinine, Ser 0.72; Hemoglobin 13.1; Platelets 181; Potassium 3.7; Sodium 139 10/06/2016: TSH 3.115   Lipid Panel No results found for: CHOL, TRIG, HDL, CHOLHDL, VLDL, LDLCALC, LDLDIRECT  Additional studies/ records that were reviewed today include:   Echocardiogram: 10/05/2016 Study Conclusions  - Left  ventricle: The cavity size was normal. Wall thickness was   normal. Systolic function was vigorous. The estimated ejection   fraction was in the range of 65% to 70%. Wall motion was normal;   there were no regional wall motion abnormalities. Doppler   parameters are consistent with abnormal left ventricular   relaxation (grade 1 diastolic dysfunction). - Mitral valve: There was mild regurgitation. - Pulmonary arteries: Systolic pressure was moderately increased.  Impressions:  - Normal LV systolic function; mild diastolic dysfunction; mild MR;   mild TR with moderately elevated pulmonary pressure.  Assessment:    1. Paroxysmal atrial fibrillation (HCC)   2. PAT (paroxysmal atrial tachycardia) ([place])   3. Chronic anticoagulation   4. Syncope, vasovagal   5. MVP (mitral valve prolapse)      Plan:   In order of problems listed above:  1. Paroxysmal Atrial Fibrillation/PAT - she has a history of PAT but was recently admitted for an unwitnessed syncopal event with telemetry showing episodes of SVT along with PAT and PAF.  - TSH within  normal limits. Echo showed a preserved EF of 65-70% with Grade 1 DD and mild MR and TR.  - she denies any repeat episodes of palpitations. No chest discomfort, dyspnea on exertion, orthopnea, PND, or lower extremity edema. - continue Eliquis 5mg  BID for anticoagulation.  - continue Toprol-XL 50mg  daily. Unable to tolerate higher dosing secondary to fatigue.   2. Chronic Anticoagulation - This patients CHA2DS2-VASc Score and unadjusted Ischemic Stroke Rate (% per year) is equal to 4.8 % stroke rate/year from a score of 4 (HTN, DM, Age, Female). Continue Eliquis 5 mg BID for anticoagulation.  - we discussed the risks and benefits of this in the setting of vasovagal syncope. She usually has prodromal symptoms with her syncopal events and is able to sit or lay down prior to passing out. Has only hit her head 2-3 times within the many years of this happening. Will continue on Eliquis at this time.  3. Vasovagal Syncope - a chronic issue for the patient. No acute changes. Importance of adequate hydration was reviewed with the patient.   4. MVP - noted in patient's history but her recent echo showed mild MR and no mention of MVP.   Medication Adjustments/Labs and Tests Ordered: Current medicines are reviewed at length with the patient today.  Concerns regarding medicines are outlined above.  Medication changes, Labs and Tests ordered today are listed in the Patient Instructions below. Patient Instructions  Medication Instructions:  CONTINUE METOPROLOL 50MG  DAILY  If you need a refill on your cardiac medications before your next appointment, please call your pharmacy.  Follow-Up: Your physician wants you to follow-up in: [place]. You will receive a reminder letter in the mail two months in advance. If you don't receive a letter, please call our office AUGUST to schedule the OCTOBER follow-up appointment.  Thank you for choosing [facility] at [facility], Brenda Heritage, PA-C  10/28/2016 5:50 PM    [place], [place], [place]  [address] Phone: [phone]; Fax: [phone]  [address] [place], [place] [address] Phone: [phone]

## 2016-10-28 ENCOUNTER — Encounter: Payer: Self-pay | Admitting: Student

## 2016-10-28 ENCOUNTER — Ambulatory Visit (INDEPENDENT_AMBULATORY_CARE_PROVIDER_SITE_OTHER): Payer: Medicare Other | Admitting: Student

## 2016-10-28 VITALS — BP 140/84 | HR 66 | Ht 64.0 in | Wt 167.2 lb

## 2016-10-28 DIAGNOSIS — I471 Supraventricular tachycardia: Secondary | ICD-10-CM | POA: Diagnosis not present

## 2016-10-28 DIAGNOSIS — R55 Syncope and collapse: Secondary | ICD-10-CM | POA: Diagnosis not present

## 2016-10-28 DIAGNOSIS — Z7901 Long term (current) use of anticoagulants: Secondary | ICD-10-CM | POA: Diagnosis not present

## 2016-10-28 DIAGNOSIS — I341 Nonrheumatic mitral (valve) prolapse: Secondary | ICD-10-CM | POA: Diagnosis not present

## 2016-10-28 DIAGNOSIS — I480 Paroxysmal atrial fibrillation: Secondary | ICD-10-CM | POA: Diagnosis not present

## 2016-10-28 MED ORDER — METOPROLOL SUCCINATE ER 50 MG PO TB24: 50.0000 mg | ORAL_TABLET | Freq: Every day | ORAL | Status: DC

## 2016-10-28 NOTE — Patient Instructions (Signed)
Medication Instructions:  CONTINUE METOPROLOL 50MG  DAILY  If you need a refill on your cardiac medications before your next appointment, please call your pharmacy.  Follow-Up: Your physician wants you to follow-up in: [place]. You will receive a reminder letter in the mail two months in advance. If you don't receive a letter, please call our office AUGUST to schedule the OCTOBER follow-up appointment.  Thank you for choosing [facility] at [facility]!!

## 2016-11-04 DIAGNOSIS — F439 Reaction to severe stress, unspecified: Secondary | ICD-10-CM | POA: Diagnosis not present

## 2016-11-04 DIAGNOSIS — I10 Essential (primary) hypertension: Secondary | ICD-10-CM | POA: Diagnosis not present

## 2016-11-05 DIAGNOSIS — R55 Syncope and collapse: Secondary | ICD-10-CM | POA: Diagnosis not present

## 2016-11-05 DIAGNOSIS — I4891 Unspecified atrial fibrillation: Secondary | ICD-10-CM | POA: Diagnosis not present

## 2016-11-05 DIAGNOSIS — R110 Nausea: Secondary | ICD-10-CM | POA: Diagnosis not present

## 2016-11-05 DIAGNOSIS — I10 Essential (primary) hypertension: Secondary | ICD-10-CM | POA: Diagnosis not present

## 2016-11-05 DIAGNOSIS — G522 Disorders of vagus nerve: Secondary | ICD-10-CM | POA: Diagnosis not present

## 2016-11-19 DIAGNOSIS — L821 Other seborrheic keratosis: Secondary | ICD-10-CM | POA: Diagnosis not present

## 2016-11-19 DIAGNOSIS — D3613 Benign neoplasm of peripheral nerves and autonomic nervous system of lower limb, including hip: Secondary | ICD-10-CM | POA: Diagnosis not present

## 2016-11-19 DIAGNOSIS — D2239 Melanocytic nevi of other parts of face: Secondary | ICD-10-CM | POA: Diagnosis not present

## 2016-11-19 DIAGNOSIS — D1801 Hemangioma of skin and subcutaneous tissue: Secondary | ICD-10-CM | POA: Diagnosis not present

## 2016-12-10 DIAGNOSIS — E785 Hyperlipidemia, unspecified: Secondary | ICD-10-CM | POA: Diagnosis not present

## 2016-12-10 DIAGNOSIS — Z79899 Other long term (current) drug therapy: Secondary | ICD-10-CM | POA: Diagnosis not present

## 2016-12-10 DIAGNOSIS — I4891 Unspecified atrial fibrillation: Secondary | ICD-10-CM | POA: Diagnosis not present

## 2016-12-10 DIAGNOSIS — J309 Allergic rhinitis, unspecified: Secondary | ICD-10-CM | POA: Diagnosis not present

## 2016-12-10 DIAGNOSIS — Z0000 Encounter for general adult medical examination without abnormal findings: Secondary | ICD-10-CM | POA: Diagnosis not present

## 2016-12-10 DIAGNOSIS — Z1389 Encounter for screening for other disorder: Secondary | ICD-10-CM | POA: Diagnosis not present

## 2016-12-10 DIAGNOSIS — E039 Hypothyroidism, unspecified: Secondary | ICD-10-CM | POA: Diagnosis not present

## 2016-12-10 DIAGNOSIS — R110 Nausea: Secondary | ICD-10-CM | POA: Diagnosis not present

## 2016-12-10 DIAGNOSIS — G522 Disorders of vagus nerve: Secondary | ICD-10-CM | POA: Diagnosis not present

## 2016-12-10 DIAGNOSIS — G609 Hereditary and idiopathic neuropathy, unspecified: Secondary | ICD-10-CM | POA: Diagnosis not present

## 2016-12-10 DIAGNOSIS — D509 Iron deficiency anemia, unspecified: Secondary | ICD-10-CM | POA: Diagnosis not present

## 2016-12-10 DIAGNOSIS — J329 Chronic sinusitis, unspecified: Secondary | ICD-10-CM | POA: Diagnosis not present

## 2016-12-10 DIAGNOSIS — E559 Vitamin D deficiency, unspecified: Secondary | ICD-10-CM | POA: Diagnosis not present

## 2016-12-10 DIAGNOSIS — E119 Type 2 diabetes mellitus without complications: Secondary | ICD-10-CM | POA: Diagnosis not present

## 2016-12-10 DIAGNOSIS — I10 Essential (primary) hypertension: Secondary | ICD-10-CM | POA: Diagnosis not present

## 2016-12-18 DIAGNOSIS — D509 Iron deficiency anemia, unspecified: Secondary | ICD-10-CM | POA: Diagnosis not present

## 2016-12-18 DIAGNOSIS — E785 Hyperlipidemia, unspecified: Secondary | ICD-10-CM | POA: Diagnosis not present

## 2016-12-18 DIAGNOSIS — E119 Type 2 diabetes mellitus without complications: Secondary | ICD-10-CM | POA: Diagnosis not present

## 2016-12-18 DIAGNOSIS — E039 Hypothyroidism, unspecified: Secondary | ICD-10-CM | POA: Diagnosis not present

## 2016-12-18 DIAGNOSIS — Z79899 Other long term (current) drug therapy: Secondary | ICD-10-CM | POA: Diagnosis not present

## 2016-12-18 DIAGNOSIS — G609 Hereditary and idiopathic neuropathy, unspecified: Secondary | ICD-10-CM | POA: Diagnosis not present

## 2016-12-18 DIAGNOSIS — I10 Essential (primary) hypertension: Secondary | ICD-10-CM | POA: Diagnosis not present

## 2016-12-18 DIAGNOSIS — Z7984 Long term (current) use of oral hypoglycemic drugs: Secondary | ICD-10-CM | POA: Diagnosis not present

## 2017-01-08 DIAGNOSIS — Z6827 Body mass index (BMI) 27.0-27.9, adult: Secondary | ICD-10-CM | POA: Diagnosis not present

## 2017-01-08 DIAGNOSIS — L509 Urticaria, unspecified: Secondary | ICD-10-CM | POA: Diagnosis not present

## 2017-01-08 DIAGNOSIS — I4891 Unspecified atrial fibrillation: Secondary | ICD-10-CM | POA: Diagnosis not present

## 2017-01-08 DIAGNOSIS — I10 Essential (primary) hypertension: Secondary | ICD-10-CM | POA: Diagnosis not present

## 2017-01-08 DIAGNOSIS — D649 Anemia, unspecified: Secondary | ICD-10-CM | POA: Diagnosis not present

## 2017-01-14 ENCOUNTER — Other Ambulatory Visit: Payer: Self-pay | Admitting: Nephrology

## 2017-01-14 DIAGNOSIS — I10 Essential (primary) hypertension: Secondary | ICD-10-CM

## 2017-01-15 DIAGNOSIS — I10 Essential (primary) hypertension: Secondary | ICD-10-CM | POA: Diagnosis not present

## 2017-01-26 DIAGNOSIS — L509 Urticaria, unspecified: Secondary | ICD-10-CM | POA: Diagnosis not present

## 2017-01-26 DIAGNOSIS — E119 Type 2 diabetes mellitus without complications: Secondary | ICD-10-CM | POA: Diagnosis not present

## 2017-01-26 DIAGNOSIS — E785 Hyperlipidemia, unspecified: Secondary | ICD-10-CM | POA: Diagnosis not present

## 2017-01-26 DIAGNOSIS — I129 Hypertensive chronic kidney disease with stage 1 through stage 4 chronic kidney disease, or unspecified chronic kidney disease: Secondary | ICD-10-CM | POA: Diagnosis not present

## 2017-01-26 DIAGNOSIS — Z6827 Body mass index (BMI) 27.0-27.9, adult: Secondary | ICD-10-CM | POA: Diagnosis not present

## 2017-01-26 DIAGNOSIS — I4891 Unspecified atrial fibrillation: Secondary | ICD-10-CM | POA: Diagnosis not present

## 2017-01-27 ENCOUNTER — Ambulatory Visit
Admission: RE | Admit: 2017-01-27 | Discharge: 2017-01-27 | Disposition: A | Discharge status: Home / Self Care | Payer: Medicare Other | Source: Ambulatory Visit | Attending: Nephrology | Admitting: Nephrology

## 2017-01-27 DIAGNOSIS — I10 Essential (primary) hypertension: Secondary | ICD-10-CM

## 2017-01-27 DIAGNOSIS — R7989 Other specified abnormal findings of blood chemistry: Secondary | ICD-10-CM | POA: Diagnosis not present

## 2017-02-06 DIAGNOSIS — Z23 Encounter for immunization: Secondary | ICD-10-CM | POA: Diagnosis not present

## 2017-02-11 DIAGNOSIS — I129 Hypertensive chronic kidney disease with stage 1 through stage 4 chronic kidney disease, or unspecified chronic kidney disease: Secondary | ICD-10-CM | POA: Diagnosis not present

## 2017-02-16 NOTE — Progress Notes (Signed)
HPI: FU PAF, MVP and vasovagal syncope. Echo 5/18 showed normal LV function, grade 1 DD, mild MR and TR and moderately elevated pulmonary pressure. Renal dopplers 9/18 showed no RAS. Admitted 5/18 with syncope (felt to be vagally mediated). Noted to have SVT, PAT and episode of atrial fibrillation on telemetry. Since last seen, the patient denies any dyspnea on exertion, orthopnea, PND, pedal edema, palpitations, syncope or chest pain.   Current Outpatient Prescriptions  Medication Sig Dispense Refill  . acetaminophen (TYLENOL) 500 MG tablet Take 500 mg by mouth every 6 (six) hours as needed for mild pain.    Marland Kitchen apixaban (ELIQUIS) 5 MG TABS tablet Take 1 tablet (5 mg total) by mouth 2 (two) times daily. 60 tablet 0  . Cholecalciferol (VITAMIN D) 2000 UNITS tablet Take 2,000 Units by mouth daily.    . diphenoxylate-atropine (LOMOTIL) 2.5-0.025 MG tablet Take 1 tablet by mouth 4 (four) times daily as needed for diarrhea or loose stools. 30 tablet 0  . doxepin (SINEQUAN) 10 MG capsule Take 10 mg by mouth at bedtime as needed (ITCHING).     . ferrous sulfate 325 (65 FE) MG tablet Take 325 mg by mouth daily with breakfast.    . metFORMIN (GLUCOPHAGE-XR) 500 MG 24 hr tablet Take 1 tablet by mouth daily.  1  . metoprolol succinate (TOPROL-XL) 25 MG 24 hr tablet Take 25 mg by mouth daily.    . ondansetron (ZOFRAN ODT) 4 MG disintegrating tablet Take 1 tablet (4 mg total) by mouth every 8 (eight) hours as needed for nausea. 6 tablet 0  . rosuvastatin (CRESTOR) 10 MG tablet Take 10 mg by mouth 3 (three) times a week.     . spironolactone (ALDACTONE) 25 MG tablet Take 12.5 mg by mouth daily.     No current facility-administered medications for this visit.      Past Medical History:  Diagnosis Date  . Diabetes mellitus without complication ([place])   . Mitral regurgitation   . Mitral valve prolapse   . Palpitations    a. known history of PAT. b. admitted for vasovagal syncope in 09/2016 and  noted to have PAF --> started on anticoagulation.   . Syncope and collapse   . Vasovagal syncope    a. occuring since childhood per the patient's report.     History reviewed. No pertinent surgical history.  Social History   Social History  . Marital status: Single    Spouse name: N/A  . Number of children: N/A  . Years of education: N/A   Occupational History  . Not on file.   Social History Main Topics  . Smoking status: Never Smoker  . Smokeless tobacco: Never Used  . Alcohol use No  . Drug use: No  . Sexual activity: Not on file   Other Topics Concern  . Not on file   Social History Narrative  . No narrative on file    Family History  Problem Relation Age of Onset  . Breast cancer Sister   . Arrhythmia Mother        atrial fib  . CVA Father     ROS: no fevers or chills, productive cough, hemoptysis, dysphasia, odynophagia, melena, hematochezia, dysuria, hematuria, rash, seizure activity, orthopnea, PND, pedal edema, claudication. Remaining systems are negative.  Physical Exam: Well-developed well-nourished in no acute distress.  Skin is warm and dry.  HEENT is normal.  Neck is supple.  Chest is clear to auscultation with normal expansion.  Cardiovascular exam is regular rate and rhythm.  Abdominal exam nontender or distended. No masses palpated. Extremities show no edema. neuro grossly intact   A/P  1 Paroxysmal atrial fibrillation-patient is in sinus rhythm today. Her LV function is normal. We will continue with beta blockade for rate control if atrial fibrillation recurs. We discussed anticoagulation today. Her previous episode of atrial fibrillation occurred in the setting of an acute file illness and may have been related to the stress of that event. She is concerned about continuing anticoagulation as she has a history of vasovagal syncope which is certainly reasonable. She would like to discontinue anticoagulation at this point. She understands there  is a higher risk of CVA with recurrent atrial fibrillation. We will therefore discontinue apixaban.  2 History of paroxysmal atrial tachycardia-continue beta blocker.   3  history of mitral valve prolapse -not evident on recent echocardiogram. Mitral regurgitation is mild.   4 history of vasovagal syncope-no recurrent episodes.  5 episodic hypertension-patient has had an extensive workup. Previous TSH and T4 normal. Catecholamines, metanephrines also negative. Renal Dopplers also negative.  6 hyperlipidemia-continue statin.   Kirk Ruths, MD

## 2017-02-18 DIAGNOSIS — R42 Dizziness and giddiness: Secondary | ICD-10-CM | POA: Diagnosis not present

## 2017-02-18 DIAGNOSIS — I129 Hypertensive chronic kidney disease with stage 1 through stage 4 chronic kidney disease, or unspecified chronic kidney disease: Secondary | ICD-10-CM | POA: Diagnosis not present

## 2017-02-18 DIAGNOSIS — E119 Type 2 diabetes mellitus without complications: Secondary | ICD-10-CM | POA: Diagnosis not present

## 2017-02-18 DIAGNOSIS — I4891 Unspecified atrial fibrillation: Secondary | ICD-10-CM | POA: Diagnosis not present

## 2017-02-20 ENCOUNTER — Telehealth: Payer: Self-pay | Admitting: Cardiology

## 2017-02-20 NOTE — Telephone Encounter (Signed)
02/20/2017 Received fax records from [place] for upcoming appointment with Dr. Stanford Breed on 03/02/2017 at 3:40 pm.  Records given to [facility]. cbr

## 2017-03-02 ENCOUNTER — Encounter: Payer: Self-pay | Admitting: Cardiology

## 2017-03-02 ENCOUNTER — Ambulatory Visit (INDEPENDENT_AMBULATORY_CARE_PROVIDER_SITE_OTHER): Payer: Medicare Other | Admitting: Cardiology

## 2017-03-02 VITALS — BP 138/76 | HR 64 | Ht 64.0 in | Wt 168.0 lb

## 2017-03-02 DIAGNOSIS — I341 Nonrheumatic mitral (valve) prolapse: Secondary | ICD-10-CM | POA: Diagnosis not present

## 2017-03-02 DIAGNOSIS — I480 Paroxysmal atrial fibrillation: Secondary | ICD-10-CM | POA: Diagnosis not present

## 2017-03-02 DIAGNOSIS — I471 Supraventricular tachycardia: Secondary | ICD-10-CM

## 2017-03-02 DIAGNOSIS — R55 Syncope and collapse: Secondary | ICD-10-CM

## 2017-03-02 NOTE — Patient Instructions (Signed)
Medication Instructions:   STOP ELIQUIS  Follow-Up:  Your physician wants you to follow-up in: [place] will receive a reminder letter in the mail two months in advance. If you don't receive a letter, please call our office to schedule the follow-up appointment.   If you need a refill on your cardiac medications before your next appointment, please call your pharmacy.

## 2017-03-16 DIAGNOSIS — H5310 Unspecified subjective visual disturbances: Secondary | ICD-10-CM | POA: Diagnosis not present

## 2017-03-31 ENCOUNTER — Encounter (HOSPITAL_COMMUNITY): Payer: Medicare Other

## 2017-03-31 ENCOUNTER — Other Ambulatory Visit: Payer: Self-pay | Admitting: Internal Medicine

## 2017-03-31 ENCOUNTER — Other Ambulatory Visit (HOSPITAL_COMMUNITY): Payer: Self-pay | Admitting: Internal Medicine

## 2017-03-31 ENCOUNTER — Ambulatory Visit
Admission: RE | Admit: 2017-03-31 | Discharge: 2017-03-31 | Disposition: A | Discharge status: Home / Self Care | Payer: Medicare Other | Source: Ambulatory Visit | Attending: Internal Medicine | Admitting: Internal Medicine

## 2017-03-31 DIAGNOSIS — R609 Edema, unspecified: Secondary | ICD-10-CM

## 2017-03-31 DIAGNOSIS — R600 Localized edema: Secondary | ICD-10-CM | POA: Diagnosis not present

## 2017-03-31 DIAGNOSIS — I10 Essential (primary) hypertension: Secondary | ICD-10-CM | POA: Diagnosis not present

## 2017-04-08 DIAGNOSIS — E785 Hyperlipidemia, unspecified: Secondary | ICD-10-CM | POA: Diagnosis not present

## 2017-04-08 DIAGNOSIS — I4891 Unspecified atrial fibrillation: Secondary | ICD-10-CM | POA: Diagnosis not present

## 2017-04-08 DIAGNOSIS — E119 Type 2 diabetes mellitus without complications: Secondary | ICD-10-CM | POA: Diagnosis not present

## 2017-04-08 DIAGNOSIS — I10 Essential (primary) hypertension: Secondary | ICD-10-CM | POA: Diagnosis not present

## 2017-04-08 DIAGNOSIS — Z7984 Long term (current) use of oral hypoglycemic drugs: Secondary | ICD-10-CM | POA: Diagnosis not present

## 2017-04-08 DIAGNOSIS — E039 Hypothyroidism, unspecified: Secondary | ICD-10-CM | POA: Diagnosis not present

## 2017-04-08 DIAGNOSIS — D509 Iron deficiency anemia, unspecified: Secondary | ICD-10-CM | POA: Diagnosis not present

## 2017-07-29 ENCOUNTER — Telehealth: Payer: Self-pay | Admitting: *Deleted

## 2017-07-29 NOTE — Telephone Encounter (Signed)
Refill request for [employer] onychomycosis nail lacquer. Dr. Amalia Hailey states refill +11. Return refill faxed.

## 2017-08-12 DIAGNOSIS — E785 Hyperlipidemia, unspecified: Secondary | ICD-10-CM | POA: Diagnosis not present

## 2017-08-12 DIAGNOSIS — D509 Iron deficiency anemia, unspecified: Secondary | ICD-10-CM | POA: Diagnosis not present

## 2017-08-12 DIAGNOSIS — E039 Hypothyroidism, unspecified: Secondary | ICD-10-CM | POA: Diagnosis not present

## 2017-08-12 DIAGNOSIS — E119 Type 2 diabetes mellitus without complications: Secondary | ICD-10-CM | POA: Diagnosis not present

## 2017-08-12 DIAGNOSIS — I10 Essential (primary) hypertension: Secondary | ICD-10-CM | POA: Diagnosis not present

## 2017-08-12 DIAGNOSIS — Z7984 Long term (current) use of oral hypoglycemic drugs: Secondary | ICD-10-CM | POA: Diagnosis not present

## 2017-08-12 DIAGNOSIS — I4891 Unspecified atrial fibrillation: Secondary | ICD-10-CM | POA: Diagnosis not present

## 2017-08-21 ENCOUNTER — Other Ambulatory Visit: Payer: Self-pay | Admitting: Internal Medicine

## 2017-08-21 ENCOUNTER — Other Ambulatory Visit: Payer: Self-pay | Admitting: Family Medicine

## 2017-08-21 DIAGNOSIS — Z1231 Encounter for screening mammogram for malignant neoplasm of breast: Secondary | ICD-10-CM

## 2017-08-21 DIAGNOSIS — Z139 Encounter for screening, unspecified: Secondary | ICD-10-CM

## 2017-09-03 ENCOUNTER — Ambulatory Visit (INDEPENDENT_AMBULATORY_CARE_PROVIDER_SITE_OTHER): Payer: Medicare Other

## 2017-09-03 ENCOUNTER — Other Ambulatory Visit: Payer: Self-pay | Admitting: Podiatry

## 2017-09-03 ENCOUNTER — Encounter: Payer: Self-pay | Admitting: Podiatry

## 2017-09-03 ENCOUNTER — Ambulatory Visit (INDEPENDENT_AMBULATORY_CARE_PROVIDER_SITE_OTHER): Payer: Medicare Other | Admitting: Podiatry

## 2017-09-03 DIAGNOSIS — R52 Pain, unspecified: Secondary | ICD-10-CM

## 2017-09-03 DIAGNOSIS — M7750 Other enthesopathy of unspecified foot: Secondary | ICD-10-CM

## 2017-09-03 DIAGNOSIS — M779 Enthesopathy, unspecified: Secondary | ICD-10-CM

## 2017-09-03 DIAGNOSIS — B351 Tinea unguium: Secondary | ICD-10-CM | POA: Diagnosis not present

## 2017-09-03 MED ORDER — TRIAMCINOLONE ACETONIDE 10 MG/ML IJ SUSP
10.0000 mg | Freq: Once | INTRAMUSCULAR | Status: AC
Start: 2017-09-03 — End: 2017-09-03
  Administered 2017-09-03: 10 mg

## 2017-09-03 NOTE — Progress Notes (Signed)
Subjective:   Patient ID: Brenda Butler, female   DOB: 72 y.o.   MRN: [id number]   HPI Patient presents stating she turned her left foot and developed a lot of pain on the side of the foot and has been inflamed and also she is concerned about the condition of her big toenails   ROS      Objective:  Physical Exam  Neurovascular status intact with inflammation lateral side left foot with fluid around base of the fifth metatarsal peroneal insertion with moderate nail disease of the hallux bilateral     Assessment:  2 separate problems one being acute tendinitis with possible fracture fifth metatarsal and the second being mycotic nail infection     Plan:  H&P x-rays reviewed both conditions discussed.  We will change her polish for the hallux nails but do not recommend aggressive treatment and I did do careful sheath injection left fifth metatarsal base to reduce inflammation and advised on ice and reduced activity.  Reappoint as NEEDED X-ray report indicates that there is no sign of fracture base of fifth metatarsal left foot or ankle fracture

## 2017-09-15 DIAGNOSIS — E119 Type 2 diabetes mellitus without complications: Secondary | ICD-10-CM | POA: Diagnosis not present

## 2017-09-15 DIAGNOSIS — I4891 Unspecified atrial fibrillation: Secondary | ICD-10-CM | POA: Diagnosis not present

## 2017-09-15 DIAGNOSIS — I129 Hypertensive chronic kidney disease with stage 1 through stage 4 chronic kidney disease, or unspecified chronic kidney disease: Secondary | ICD-10-CM | POA: Diagnosis not present

## 2017-09-22 ENCOUNTER — Ambulatory Visit
Admission: RE | Admit: 2017-09-22 | Discharge: 2017-09-22 | Disposition: A | Discharge status: Home / Self Care | Payer: Medicare Other | Source: Ambulatory Visit | Attending: Family Medicine | Admitting: Family Medicine

## 2017-09-22 DIAGNOSIS — Z1231 Encounter for screening mammogram for malignant neoplasm of breast: Secondary | ICD-10-CM | POA: Diagnosis not present

## 2017-11-18 DIAGNOSIS — L821 Other seborrheic keratosis: Secondary | ICD-10-CM | POA: Diagnosis not present

## 2017-11-18 DIAGNOSIS — D2239 Melanocytic nevi of other parts of face: Secondary | ICD-10-CM | POA: Diagnosis not present

## 2017-11-18 DIAGNOSIS — D225 Melanocytic nevi of trunk: Secondary | ICD-10-CM | POA: Diagnosis not present

## 2018-01-05 DIAGNOSIS — M1990 Unspecified osteoarthritis, unspecified site: Secondary | ICD-10-CM | POA: Diagnosis not present

## 2018-01-05 DIAGNOSIS — I10 Essential (primary) hypertension: Secondary | ICD-10-CM | POA: Diagnosis not present

## 2018-01-05 DIAGNOSIS — Z79899 Other long term (current) drug therapy: Secondary | ICD-10-CM | POA: Diagnosis not present

## 2018-01-05 DIAGNOSIS — Z0000 Encounter for general adult medical examination without abnormal findings: Secondary | ICD-10-CM | POA: Diagnosis not present

## 2018-01-05 DIAGNOSIS — Z1389 Encounter for screening for other disorder: Secondary | ICD-10-CM | POA: Diagnosis not present

## 2018-01-05 DIAGNOSIS — M8580 Other specified disorders of bone density and structure, unspecified site: Secondary | ICD-10-CM | POA: Diagnosis not present

## 2018-01-05 DIAGNOSIS — E039 Hypothyroidism, unspecified: Secondary | ICD-10-CM | POA: Diagnosis not present

## 2018-01-05 DIAGNOSIS — Z7984 Long term (current) use of oral hypoglycemic drugs: Secondary | ICD-10-CM | POA: Diagnosis not present

## 2018-01-05 DIAGNOSIS — D509 Iron deficiency anemia, unspecified: Secondary | ICD-10-CM | POA: Diagnosis not present

## 2018-01-05 DIAGNOSIS — E785 Hyperlipidemia, unspecified: Secondary | ICD-10-CM | POA: Diagnosis not present

## 2018-01-05 DIAGNOSIS — I4891 Unspecified atrial fibrillation: Secondary | ICD-10-CM | POA: Diagnosis not present

## 2018-01-05 DIAGNOSIS — E119 Type 2 diabetes mellitus without complications: Secondary | ICD-10-CM | POA: Diagnosis not present

## 2018-01-21 DIAGNOSIS — E119 Type 2 diabetes mellitus without complications: Secondary | ICD-10-CM | POA: Diagnosis not present

## 2018-01-27 DIAGNOSIS — D485 Neoplasm of uncertain behavior of skin: Secondary | ICD-10-CM | POA: Diagnosis not present

## 2018-02-02 DIAGNOSIS — Z23 Encounter for immunization: Secondary | ICD-10-CM | POA: Diagnosis not present

## 2018-02-26 ENCOUNTER — Other Ambulatory Visit: Payer: Self-pay | Admitting: Nurse Practitioner

## 2018-02-26 ENCOUNTER — Other Ambulatory Visit (HOSPITAL_COMMUNITY)
Admission: RE | Admit: 2018-02-26 | Discharge: 2018-02-26 | Disposition: A | Discharge status: Home / Self Care | Payer: Medicare Other | Source: Ambulatory Visit | Attending: Nurse Practitioner | Admitting: Nurse Practitioner

## 2018-02-26 DIAGNOSIS — N939 Abnormal uterine and vaginal bleeding, unspecified: Secondary | ICD-10-CM | POA: Insufficient documentation

## 2018-02-26 DIAGNOSIS — E039 Hypothyroidism, unspecified: Secondary | ICD-10-CM | POA: Diagnosis not present

## 2018-02-26 DIAGNOSIS — K625 Hemorrhage of anus and rectum: Secondary | ICD-10-CM | POA: Diagnosis not present

## 2018-03-01 DIAGNOSIS — N952 Postmenopausal atrophic vaginitis: Secondary | ICD-10-CM | POA: Diagnosis not present

## 2018-03-01 DIAGNOSIS — N950 Postmenopausal bleeding: Secondary | ICD-10-CM | POA: Diagnosis not present

## 2018-03-01 DIAGNOSIS — K2900 Acute gastritis without bleeding: Secondary | ICD-10-CM | POA: Diagnosis not present

## 2018-03-03 DIAGNOSIS — R102 Pelvic and perineal pain: Secondary | ICD-10-CM | POA: Diagnosis not present

## 2018-03-03 DIAGNOSIS — N950 Postmenopausal bleeding: Secondary | ICD-10-CM | POA: Diagnosis not present

## 2018-03-03 LAB — CYTOLOGY - PAP
Diagnosis: NEGATIVE
HPV (WINDOPATH): NOT DETECTED

## 2018-03-16 DIAGNOSIS — Z961 Presence of intraocular lens: Secondary | ICD-10-CM | POA: Diagnosis not present

## 2018-03-16 DIAGNOSIS — E119 Type 2 diabetes mellitus without complications: Secondary | ICD-10-CM | POA: Diagnosis not present

## 2018-04-21 DIAGNOSIS — K635 Polyp of colon: Secondary | ICD-10-CM | POA: Diagnosis not present

## 2018-04-21 DIAGNOSIS — D123 Benign neoplasm of transverse colon: Secondary | ICD-10-CM | POA: Diagnosis not present

## 2018-04-21 DIAGNOSIS — Z86010 Personal history of colonic polyps: Secondary | ICD-10-CM | POA: Diagnosis not present

## 2018-04-23 DIAGNOSIS — K635 Polyp of colon: Secondary | ICD-10-CM | POA: Diagnosis not present

## 2018-04-23 DIAGNOSIS — D123 Benign neoplasm of transverse colon: Secondary | ICD-10-CM | POA: Diagnosis not present

## 2018-04-29 DIAGNOSIS — N882 Stricture and stenosis of cervix uteri: Secondary | ICD-10-CM | POA: Diagnosis not present

## 2018-04-29 DIAGNOSIS — N950 Postmenopausal bleeding: Secondary | ICD-10-CM | POA: Diagnosis not present

## 2018-07-16 DIAGNOSIS — E119 Type 2 diabetes mellitus without complications: Secondary | ICD-10-CM | POA: Diagnosis not present

## 2018-07-16 DIAGNOSIS — S81811A Laceration without foreign body, right lower leg, initial encounter: Secondary | ICD-10-CM | POA: Diagnosis not present

## 2018-07-19 DIAGNOSIS — E785 Hyperlipidemia, unspecified: Secondary | ICD-10-CM | POA: Diagnosis not present

## 2018-07-19 DIAGNOSIS — E039 Hypothyroidism, unspecified: Secondary | ICD-10-CM | POA: Diagnosis not present

## 2018-07-19 DIAGNOSIS — I10 Essential (primary) hypertension: Secondary | ICD-10-CM | POA: Diagnosis not present

## 2018-07-19 DIAGNOSIS — D509 Iron deficiency anemia, unspecified: Secondary | ICD-10-CM | POA: Diagnosis not present

## 2018-07-19 DIAGNOSIS — E1165 Type 2 diabetes mellitus with hyperglycemia: Secondary | ICD-10-CM | POA: Diagnosis not present

## 2018-07-19 DIAGNOSIS — E119 Type 2 diabetes mellitus without complications: Secondary | ICD-10-CM | POA: Diagnosis not present

## 2018-07-19 DIAGNOSIS — M1990 Unspecified osteoarthritis, unspecified site: Secondary | ICD-10-CM | POA: Diagnosis not present

## 2018-07-19 DIAGNOSIS — I4891 Unspecified atrial fibrillation: Secondary | ICD-10-CM | POA: Diagnosis not present

## 2018-10-13 DIAGNOSIS — M9901 Segmental and somatic dysfunction of cervical region: Secondary | ICD-10-CM | POA: Diagnosis not present

## 2018-10-13 DIAGNOSIS — M9903 Segmental and somatic dysfunction of lumbar region: Secondary | ICD-10-CM | POA: Diagnosis not present

## 2018-10-13 DIAGNOSIS — M9902 Segmental and somatic dysfunction of thoracic region: Secondary | ICD-10-CM | POA: Diagnosis not present

## 2018-10-13 DIAGNOSIS — M9905 Segmental and somatic dysfunction of pelvic region: Secondary | ICD-10-CM | POA: Diagnosis not present

## 2018-10-14 DIAGNOSIS — M9903 Segmental and somatic dysfunction of lumbar region: Secondary | ICD-10-CM | POA: Diagnosis not present

## 2018-10-14 DIAGNOSIS — M9901 Segmental and somatic dysfunction of cervical region: Secondary | ICD-10-CM | POA: Diagnosis not present

## 2018-10-14 DIAGNOSIS — M9905 Segmental and somatic dysfunction of pelvic region: Secondary | ICD-10-CM | POA: Diagnosis not present

## 2018-10-14 DIAGNOSIS — M9902 Segmental and somatic dysfunction of thoracic region: Secondary | ICD-10-CM | POA: Diagnosis not present

## 2018-10-15 DIAGNOSIS — M9903 Segmental and somatic dysfunction of lumbar region: Secondary | ICD-10-CM | POA: Diagnosis not present

## 2018-10-15 DIAGNOSIS — M9905 Segmental and somatic dysfunction of pelvic region: Secondary | ICD-10-CM | POA: Diagnosis not present

## 2018-10-15 DIAGNOSIS — M9901 Segmental and somatic dysfunction of cervical region: Secondary | ICD-10-CM | POA: Diagnosis not present

## 2018-10-15 DIAGNOSIS — M9902 Segmental and somatic dysfunction of thoracic region: Secondary | ICD-10-CM | POA: Diagnosis not present

## 2018-10-18 DIAGNOSIS — M9903 Segmental and somatic dysfunction of lumbar region: Secondary | ICD-10-CM | POA: Diagnosis not present

## 2018-10-18 DIAGNOSIS — M9905 Segmental and somatic dysfunction of pelvic region: Secondary | ICD-10-CM | POA: Diagnosis not present

## 2018-10-18 DIAGNOSIS — M9901 Segmental and somatic dysfunction of cervical region: Secondary | ICD-10-CM | POA: Diagnosis not present

## 2018-10-18 DIAGNOSIS — M9902 Segmental and somatic dysfunction of thoracic region: Secondary | ICD-10-CM | POA: Diagnosis not present

## 2018-10-20 DIAGNOSIS — M9902 Segmental and somatic dysfunction of thoracic region: Secondary | ICD-10-CM | POA: Diagnosis not present

## 2018-10-20 DIAGNOSIS — M9901 Segmental and somatic dysfunction of cervical region: Secondary | ICD-10-CM | POA: Diagnosis not present

## 2018-10-20 DIAGNOSIS — M9903 Segmental and somatic dysfunction of lumbar region: Secondary | ICD-10-CM | POA: Diagnosis not present

## 2018-10-20 DIAGNOSIS — M9905 Segmental and somatic dysfunction of pelvic region: Secondary | ICD-10-CM | POA: Diagnosis not present

## 2018-10-21 DIAGNOSIS — M9902 Segmental and somatic dysfunction of thoracic region: Secondary | ICD-10-CM | POA: Diagnosis not present

## 2018-10-21 DIAGNOSIS — M9901 Segmental and somatic dysfunction of cervical region: Secondary | ICD-10-CM | POA: Diagnosis not present

## 2018-10-21 DIAGNOSIS — M9905 Segmental and somatic dysfunction of pelvic region: Secondary | ICD-10-CM | POA: Diagnosis not present

## 2018-10-21 DIAGNOSIS — M9903 Segmental and somatic dysfunction of lumbar region: Secondary | ICD-10-CM | POA: Diagnosis not present

## 2018-10-25 DIAGNOSIS — M9902 Segmental and somatic dysfunction of thoracic region: Secondary | ICD-10-CM | POA: Diagnosis not present

## 2018-10-25 DIAGNOSIS — M9905 Segmental and somatic dysfunction of pelvic region: Secondary | ICD-10-CM | POA: Diagnosis not present

## 2018-10-25 DIAGNOSIS — M9901 Segmental and somatic dysfunction of cervical region: Secondary | ICD-10-CM | POA: Diagnosis not present

## 2018-10-25 DIAGNOSIS — M9903 Segmental and somatic dysfunction of lumbar region: Secondary | ICD-10-CM | POA: Diagnosis not present

## 2018-10-27 DIAGNOSIS — M9902 Segmental and somatic dysfunction of thoracic region: Secondary | ICD-10-CM | POA: Diagnosis not present

## 2018-10-27 DIAGNOSIS — M9903 Segmental and somatic dysfunction of lumbar region: Secondary | ICD-10-CM | POA: Diagnosis not present

## 2018-10-27 DIAGNOSIS — M9901 Segmental and somatic dysfunction of cervical region: Secondary | ICD-10-CM | POA: Diagnosis not present

## 2018-10-27 DIAGNOSIS — M9905 Segmental and somatic dysfunction of pelvic region: Secondary | ICD-10-CM | POA: Diagnosis not present

## 2018-10-28 DIAGNOSIS — M9903 Segmental and somatic dysfunction of lumbar region: Secondary | ICD-10-CM | POA: Diagnosis not present

## 2018-10-28 DIAGNOSIS — M9901 Segmental and somatic dysfunction of cervical region: Secondary | ICD-10-CM | POA: Diagnosis not present

## 2018-10-28 DIAGNOSIS — M9902 Segmental and somatic dysfunction of thoracic region: Secondary | ICD-10-CM | POA: Diagnosis not present

## 2018-10-28 DIAGNOSIS — M9905 Segmental and somatic dysfunction of pelvic region: Secondary | ICD-10-CM | POA: Diagnosis not present

## 2018-11-01 DIAGNOSIS — M9901 Segmental and somatic dysfunction of cervical region: Secondary | ICD-10-CM | POA: Diagnosis not present

## 2018-11-01 DIAGNOSIS — M9903 Segmental and somatic dysfunction of lumbar region: Secondary | ICD-10-CM | POA: Diagnosis not present

## 2018-11-01 DIAGNOSIS — M9905 Segmental and somatic dysfunction of pelvic region: Secondary | ICD-10-CM | POA: Diagnosis not present

## 2018-11-01 DIAGNOSIS — M9902 Segmental and somatic dysfunction of thoracic region: Secondary | ICD-10-CM | POA: Diagnosis not present

## 2018-11-03 ENCOUNTER — Other Ambulatory Visit: Payer: Self-pay | Admitting: Internal Medicine

## 2018-11-03 DIAGNOSIS — M9902 Segmental and somatic dysfunction of thoracic region: Secondary | ICD-10-CM | POA: Diagnosis not present

## 2018-11-03 DIAGNOSIS — Z1231 Encounter for screening mammogram for malignant neoplasm of breast: Secondary | ICD-10-CM

## 2018-11-03 DIAGNOSIS — M9903 Segmental and somatic dysfunction of lumbar region: Secondary | ICD-10-CM | POA: Diagnosis not present

## 2018-11-03 DIAGNOSIS — M9905 Segmental and somatic dysfunction of pelvic region: Secondary | ICD-10-CM | POA: Diagnosis not present

## 2018-11-03 DIAGNOSIS — M9901 Segmental and somatic dysfunction of cervical region: Secondary | ICD-10-CM | POA: Diagnosis not present

## 2018-11-04 DIAGNOSIS — M9905 Segmental and somatic dysfunction of pelvic region: Secondary | ICD-10-CM | POA: Diagnosis not present

## 2018-11-04 DIAGNOSIS — M9902 Segmental and somatic dysfunction of thoracic region: Secondary | ICD-10-CM | POA: Diagnosis not present

## 2018-11-04 DIAGNOSIS — M9903 Segmental and somatic dysfunction of lumbar region: Secondary | ICD-10-CM | POA: Diagnosis not present

## 2018-11-04 DIAGNOSIS — M9901 Segmental and somatic dysfunction of cervical region: Secondary | ICD-10-CM | POA: Diagnosis not present

## 2018-11-08 DIAGNOSIS — M9903 Segmental and somatic dysfunction of lumbar region: Secondary | ICD-10-CM | POA: Diagnosis not present

## 2018-11-08 DIAGNOSIS — M9905 Segmental and somatic dysfunction of pelvic region: Secondary | ICD-10-CM | POA: Diagnosis not present

## 2018-11-08 DIAGNOSIS — M9902 Segmental and somatic dysfunction of thoracic region: Secondary | ICD-10-CM | POA: Diagnosis not present

## 2018-11-08 DIAGNOSIS — M9901 Segmental and somatic dysfunction of cervical region: Secondary | ICD-10-CM | POA: Diagnosis not present

## 2018-11-11 DIAGNOSIS — M9905 Segmental and somatic dysfunction of pelvic region: Secondary | ICD-10-CM | POA: Diagnosis not present

## 2018-11-11 DIAGNOSIS — M9901 Segmental and somatic dysfunction of cervical region: Secondary | ICD-10-CM | POA: Diagnosis not present

## 2018-11-11 DIAGNOSIS — M9902 Segmental and somatic dysfunction of thoracic region: Secondary | ICD-10-CM | POA: Diagnosis not present

## 2018-11-11 DIAGNOSIS — M9903 Segmental and somatic dysfunction of lumbar region: Secondary | ICD-10-CM | POA: Diagnosis not present

## 2018-11-16 DIAGNOSIS — M9902 Segmental and somatic dysfunction of thoracic region: Secondary | ICD-10-CM | POA: Diagnosis not present

## 2018-11-16 DIAGNOSIS — M9901 Segmental and somatic dysfunction of cervical region: Secondary | ICD-10-CM | POA: Diagnosis not present

## 2018-11-16 DIAGNOSIS — M9903 Segmental and somatic dysfunction of lumbar region: Secondary | ICD-10-CM | POA: Diagnosis not present

## 2018-11-16 DIAGNOSIS — M9905 Segmental and somatic dysfunction of pelvic region: Secondary | ICD-10-CM | POA: Diagnosis not present

## 2018-11-18 DIAGNOSIS — M9903 Segmental and somatic dysfunction of lumbar region: Secondary | ICD-10-CM | POA: Diagnosis not present

## 2018-11-18 DIAGNOSIS — M9902 Segmental and somatic dysfunction of thoracic region: Secondary | ICD-10-CM | POA: Diagnosis not present

## 2018-11-18 DIAGNOSIS — M9901 Segmental and somatic dysfunction of cervical region: Secondary | ICD-10-CM | POA: Diagnosis not present

## 2018-11-18 DIAGNOSIS — M9905 Segmental and somatic dysfunction of pelvic region: Secondary | ICD-10-CM | POA: Diagnosis not present

## 2018-11-22 DIAGNOSIS — D225 Melanocytic nevi of trunk: Secondary | ICD-10-CM | POA: Diagnosis not present

## 2018-11-22 DIAGNOSIS — L821 Other seborrheic keratosis: Secondary | ICD-10-CM | POA: Diagnosis not present

## 2018-11-22 DIAGNOSIS — D2222 Melanocytic nevi of left ear and external auricular canal: Secondary | ICD-10-CM | POA: Diagnosis not present

## 2018-12-10 DIAGNOSIS — I10 Essential (primary) hypertension: Secondary | ICD-10-CM | POA: Diagnosis not present

## 2018-12-10 DIAGNOSIS — M8580 Other specified disorders of bone density and structure, unspecified site: Secondary | ICD-10-CM | POA: Diagnosis not present

## 2018-12-10 DIAGNOSIS — E7800 Pure hypercholesterolemia, unspecified: Secondary | ICD-10-CM | POA: Diagnosis not present

## 2018-12-10 DIAGNOSIS — E119 Type 2 diabetes mellitus without complications: Secondary | ICD-10-CM | POA: Diagnosis not present

## 2018-12-10 DIAGNOSIS — M1990 Unspecified osteoarthritis, unspecified site: Secondary | ICD-10-CM | POA: Diagnosis not present

## 2018-12-10 DIAGNOSIS — E039 Hypothyroidism, unspecified: Secondary | ICD-10-CM | POA: Diagnosis not present

## 2018-12-10 DIAGNOSIS — D509 Iron deficiency anemia, unspecified: Secondary | ICD-10-CM | POA: Diagnosis not present

## 2018-12-10 DIAGNOSIS — E785 Hyperlipidemia, unspecified: Secondary | ICD-10-CM | POA: Diagnosis not present

## 2018-12-10 DIAGNOSIS — E1165 Type 2 diabetes mellitus with hyperglycemia: Secondary | ICD-10-CM | POA: Diagnosis not present

## 2018-12-10 DIAGNOSIS — I4891 Unspecified atrial fibrillation: Secondary | ICD-10-CM | POA: Diagnosis not present

## 2018-12-17 ENCOUNTER — Other Ambulatory Visit: Payer: Self-pay

## 2018-12-17 ENCOUNTER — Ambulatory Visit
Admission: RE | Admit: 2018-12-17 | Discharge: 2018-12-17 | Disposition: A | Discharge status: Home / Self Care | Payer: Medicare Other | Source: Ambulatory Visit | Attending: Internal Medicine | Admitting: Internal Medicine

## 2018-12-17 DIAGNOSIS — Z1231 Encounter for screening mammogram for malignant neoplasm of breast: Secondary | ICD-10-CM | POA: Diagnosis not present

## 2019-01-03 DIAGNOSIS — M8580 Other specified disorders of bone density and structure, unspecified site: Secondary | ICD-10-CM | POA: Diagnosis not present

## 2019-01-03 DIAGNOSIS — E1165 Type 2 diabetes mellitus with hyperglycemia: Secondary | ICD-10-CM | POA: Diagnosis not present

## 2019-01-03 DIAGNOSIS — D509 Iron deficiency anemia, unspecified: Secondary | ICD-10-CM | POA: Diagnosis not present

## 2019-01-03 DIAGNOSIS — E785 Hyperlipidemia, unspecified: Secondary | ICD-10-CM | POA: Diagnosis not present

## 2019-01-03 DIAGNOSIS — I10 Essential (primary) hypertension: Secondary | ICD-10-CM | POA: Diagnosis not present

## 2019-01-03 DIAGNOSIS — M1990 Unspecified osteoarthritis, unspecified site: Secondary | ICD-10-CM | POA: Diagnosis not present

## 2019-01-03 DIAGNOSIS — E7800 Pure hypercholesterolemia, unspecified: Secondary | ICD-10-CM | POA: Diagnosis not present

## 2019-01-03 DIAGNOSIS — E039 Hypothyroidism, unspecified: Secondary | ICD-10-CM | POA: Diagnosis not present

## 2019-01-03 DIAGNOSIS — E119 Type 2 diabetes mellitus without complications: Secondary | ICD-10-CM | POA: Diagnosis not present

## 2019-01-03 DIAGNOSIS — I4891 Unspecified atrial fibrillation: Secondary | ICD-10-CM | POA: Diagnosis not present

## 2019-01-10 DIAGNOSIS — I129 Hypertensive chronic kidney disease with stage 1 through stage 4 chronic kidney disease, or unspecified chronic kidney disease: Secondary | ICD-10-CM | POA: Diagnosis not present

## 2019-01-10 DIAGNOSIS — I4891 Unspecified atrial fibrillation: Secondary | ICD-10-CM | POA: Diagnosis not present

## 2019-01-10 DIAGNOSIS — E119 Type 2 diabetes mellitus without complications: Secondary | ICD-10-CM | POA: Diagnosis not present

## 2019-01-25 DIAGNOSIS — D509 Iron deficiency anemia, unspecified: Secondary | ICD-10-CM | POA: Diagnosis not present

## 2019-01-25 DIAGNOSIS — Z23 Encounter for immunization: Secondary | ICD-10-CM | POA: Diagnosis not present

## 2019-01-25 DIAGNOSIS — Z0000 Encounter for general adult medical examination without abnormal findings: Secondary | ICD-10-CM | POA: Diagnosis not present

## 2019-01-25 DIAGNOSIS — M1990 Unspecified osteoarthritis, unspecified site: Secondary | ICD-10-CM | POA: Diagnosis not present

## 2019-01-25 DIAGNOSIS — E1165 Type 2 diabetes mellitus with hyperglycemia: Secondary | ICD-10-CM | POA: Diagnosis not present

## 2019-01-25 DIAGNOSIS — I10 Essential (primary) hypertension: Secondary | ICD-10-CM | POA: Diagnosis not present

## 2019-01-25 DIAGNOSIS — Z1389 Encounter for screening for other disorder: Secondary | ICD-10-CM | POA: Diagnosis not present

## 2019-01-25 DIAGNOSIS — E559 Vitamin D deficiency, unspecified: Secondary | ICD-10-CM | POA: Diagnosis not present

## 2019-01-25 DIAGNOSIS — M8580 Other specified disorders of bone density and structure, unspecified site: Secondary | ICD-10-CM | POA: Diagnosis not present

## 2019-01-25 DIAGNOSIS — E785 Hyperlipidemia, unspecified: Secondary | ICD-10-CM | POA: Diagnosis not present

## 2019-01-25 DIAGNOSIS — E039 Hypothyroidism, unspecified: Secondary | ICD-10-CM | POA: Diagnosis not present

## 2019-02-07 DIAGNOSIS — D2222 Melanocytic nevi of left ear and external auricular canal: Secondary | ICD-10-CM | POA: Diagnosis not present

## 2019-02-07 DIAGNOSIS — L570 Actinic keratosis: Secondary | ICD-10-CM | POA: Diagnosis not present

## 2019-02-07 DIAGNOSIS — L820 Inflamed seborrheic keratosis: Secondary | ICD-10-CM | POA: Diagnosis not present

## 2019-03-22 DIAGNOSIS — Z961 Presence of intraocular lens: Secondary | ICD-10-CM | POA: Diagnosis not present

## 2019-03-22 DIAGNOSIS — E119 Type 2 diabetes mellitus without complications: Secondary | ICD-10-CM | POA: Diagnosis not present

## 2019-05-16 DIAGNOSIS — M9905 Segmental and somatic dysfunction of pelvic region: Secondary | ICD-10-CM | POA: Diagnosis not present

## 2019-05-16 DIAGNOSIS — M62830 Muscle spasm of back: Secondary | ICD-10-CM | POA: Diagnosis not present

## 2019-05-16 DIAGNOSIS — M5416 Radiculopathy, lumbar region: Secondary | ICD-10-CM | POA: Diagnosis not present

## 2019-05-16 DIAGNOSIS — M9903 Segmental and somatic dysfunction of lumbar region: Secondary | ICD-10-CM | POA: Diagnosis not present

## 2019-05-18 DIAGNOSIS — M5416 Radiculopathy, lumbar region: Secondary | ICD-10-CM | POA: Diagnosis not present

## 2019-05-18 DIAGNOSIS — M9903 Segmental and somatic dysfunction of lumbar region: Secondary | ICD-10-CM | POA: Diagnosis not present

## 2019-05-18 DIAGNOSIS — M62830 Muscle spasm of back: Secondary | ICD-10-CM | POA: Diagnosis not present

## 2019-05-18 DIAGNOSIS — M9905 Segmental and somatic dysfunction of pelvic region: Secondary | ICD-10-CM | POA: Diagnosis not present

## 2019-05-20 DIAGNOSIS — M9905 Segmental and somatic dysfunction of pelvic region: Secondary | ICD-10-CM | POA: Diagnosis not present

## 2019-05-20 DIAGNOSIS — M9903 Segmental and somatic dysfunction of lumbar region: Secondary | ICD-10-CM | POA: Diagnosis not present

## 2019-05-20 DIAGNOSIS — M5416 Radiculopathy, lumbar region: Secondary | ICD-10-CM | POA: Diagnosis not present

## 2019-05-20 DIAGNOSIS — M62830 Muscle spasm of back: Secondary | ICD-10-CM | POA: Diagnosis not present

## 2019-05-23 DIAGNOSIS — M9903 Segmental and somatic dysfunction of lumbar region: Secondary | ICD-10-CM | POA: Diagnosis not present

## 2019-05-23 DIAGNOSIS — M62830 Muscle spasm of back: Secondary | ICD-10-CM | POA: Diagnosis not present

## 2019-05-23 DIAGNOSIS — M9905 Segmental and somatic dysfunction of pelvic region: Secondary | ICD-10-CM | POA: Diagnosis not present

## 2019-05-23 DIAGNOSIS — M5416 Radiculopathy, lumbar region: Secondary | ICD-10-CM | POA: Diagnosis not present

## 2019-05-25 DIAGNOSIS — M5416 Radiculopathy, lumbar region: Secondary | ICD-10-CM | POA: Diagnosis not present

## 2019-05-25 DIAGNOSIS — M9905 Segmental and somatic dysfunction of pelvic region: Secondary | ICD-10-CM | POA: Diagnosis not present

## 2019-05-25 DIAGNOSIS — M62830 Muscle spasm of back: Secondary | ICD-10-CM | POA: Diagnosis not present

## 2019-05-25 DIAGNOSIS — M9903 Segmental and somatic dysfunction of lumbar region: Secondary | ICD-10-CM | POA: Diagnosis not present

## 2019-05-30 DIAGNOSIS — M62830 Muscle spasm of back: Secondary | ICD-10-CM | POA: Diagnosis not present

## 2019-05-30 DIAGNOSIS — M9905 Segmental and somatic dysfunction of pelvic region: Secondary | ICD-10-CM | POA: Diagnosis not present

## 2019-05-30 DIAGNOSIS — M9903 Segmental and somatic dysfunction of lumbar region: Secondary | ICD-10-CM | POA: Diagnosis not present

## 2019-05-30 DIAGNOSIS — M5416 Radiculopathy, lumbar region: Secondary | ICD-10-CM | POA: Diagnosis not present

## 2019-06-01 DIAGNOSIS — M62830 Muscle spasm of back: Secondary | ICD-10-CM | POA: Diagnosis not present

## 2019-06-01 DIAGNOSIS — M9903 Segmental and somatic dysfunction of lumbar region: Secondary | ICD-10-CM | POA: Diagnosis not present

## 2019-06-01 DIAGNOSIS — M5416 Radiculopathy, lumbar region: Secondary | ICD-10-CM | POA: Diagnosis not present

## 2019-06-01 DIAGNOSIS — M9905 Segmental and somatic dysfunction of pelvic region: Secondary | ICD-10-CM | POA: Diagnosis not present

## 2019-06-03 DIAGNOSIS — Z23 Encounter for immunization: Secondary | ICD-10-CM | POA: Diagnosis not present

## 2019-06-06 DIAGNOSIS — M9903 Segmental and somatic dysfunction of lumbar region: Secondary | ICD-10-CM | POA: Diagnosis not present

## 2019-06-06 DIAGNOSIS — M5416 Radiculopathy, lumbar region: Secondary | ICD-10-CM | POA: Diagnosis not present

## 2019-06-06 DIAGNOSIS — M9905 Segmental and somatic dysfunction of pelvic region: Secondary | ICD-10-CM | POA: Diagnosis not present

## 2019-06-06 DIAGNOSIS — M62830 Muscle spasm of back: Secondary | ICD-10-CM | POA: Diagnosis not present

## 2019-06-08 DIAGNOSIS — M9905 Segmental and somatic dysfunction of pelvic region: Secondary | ICD-10-CM | POA: Diagnosis not present

## 2019-06-08 DIAGNOSIS — M62830 Muscle spasm of back: Secondary | ICD-10-CM | POA: Diagnosis not present

## 2019-06-08 DIAGNOSIS — M9903 Segmental and somatic dysfunction of lumbar region: Secondary | ICD-10-CM | POA: Diagnosis not present

## 2019-06-08 DIAGNOSIS — S46011A Strain of muscle(s) and tendon(s) of the rotator cuff of right shoulder, initial encounter: Secondary | ICD-10-CM | POA: Diagnosis not present

## 2019-06-08 DIAGNOSIS — M5416 Radiculopathy, lumbar region: Secondary | ICD-10-CM | POA: Diagnosis not present

## 2019-06-15 DIAGNOSIS — M62830 Muscle spasm of back: Secondary | ICD-10-CM | POA: Diagnosis not present

## 2019-06-15 DIAGNOSIS — M5416 Radiculopathy, lumbar region: Secondary | ICD-10-CM | POA: Diagnosis not present

## 2019-06-15 DIAGNOSIS — M9903 Segmental and somatic dysfunction of lumbar region: Secondary | ICD-10-CM | POA: Diagnosis not present

## 2019-06-15 DIAGNOSIS — M9905 Segmental and somatic dysfunction of pelvic region: Secondary | ICD-10-CM | POA: Diagnosis not present

## 2019-06-22 DIAGNOSIS — M5416 Radiculopathy, lumbar region: Secondary | ICD-10-CM | POA: Diagnosis not present

## 2019-06-22 DIAGNOSIS — M9905 Segmental and somatic dysfunction of pelvic region: Secondary | ICD-10-CM | POA: Diagnosis not present

## 2019-06-22 DIAGNOSIS — M62830 Muscle spasm of back: Secondary | ICD-10-CM | POA: Diagnosis not present

## 2019-06-22 DIAGNOSIS — M9903 Segmental and somatic dysfunction of lumbar region: Secondary | ICD-10-CM | POA: Diagnosis not present

## 2019-06-29 DIAGNOSIS — Z23 Encounter for immunization: Secondary | ICD-10-CM | POA: Diagnosis not present

## 2019-07-19 DIAGNOSIS — S46011D Strain of muscle(s) and tendon(s) of the rotator cuff of right shoulder, subsequent encounter: Secondary | ICD-10-CM | POA: Diagnosis not present

## 2019-07-26 DIAGNOSIS — Z01419 Encounter for gynecological examination (general) (routine) without abnormal findings: Secondary | ICD-10-CM | POA: Diagnosis not present

## 2019-11-17 ENCOUNTER — Other Ambulatory Visit: Payer: Self-pay | Admitting: Internal Medicine

## 2019-11-17 DIAGNOSIS — Z1231 Encounter for screening mammogram for malignant neoplasm of breast: Secondary | ICD-10-CM

## 2019-12-19 ENCOUNTER — Other Ambulatory Visit: Payer: Self-pay

## 2019-12-19 ENCOUNTER — Ambulatory Visit
Admission: RE | Admit: 2019-12-19 | Discharge: 2019-12-19 | Disposition: A | Discharge status: Home / Self Care | Payer: Medicare Other | Source: Ambulatory Visit | Attending: Internal Medicine | Admitting: Internal Medicine

## 2019-12-19 DIAGNOSIS — Z1231 Encounter for screening mammogram for malignant neoplasm of breast: Secondary | ICD-10-CM | POA: Diagnosis not present

## 2020-01-18 DIAGNOSIS — Z23 Encounter for immunization: Secondary | ICD-10-CM | POA: Diagnosis not present

## 2020-02-07 DIAGNOSIS — Z79899 Other long term (current) drug therapy: Secondary | ICD-10-CM | POA: Diagnosis not present

## 2020-02-07 DIAGNOSIS — E119 Type 2 diabetes mellitus without complications: Secondary | ICD-10-CM | POA: Diagnosis not present

## 2020-02-07 DIAGNOSIS — I10 Essential (primary) hypertension: Secondary | ICD-10-CM | POA: Diagnosis not present

## 2020-02-07 DIAGNOSIS — D509 Iron deficiency anemia, unspecified: Secondary | ICD-10-CM | POA: Diagnosis not present

## 2020-02-07 DIAGNOSIS — Z1389 Encounter for screening for other disorder: Secondary | ICD-10-CM | POA: Diagnosis not present

## 2020-02-07 DIAGNOSIS — Z7984 Long term (current) use of oral hypoglycemic drugs: Secondary | ICD-10-CM | POA: Diagnosis not present

## 2020-02-07 DIAGNOSIS — E1165 Type 2 diabetes mellitus with hyperglycemia: Secondary | ICD-10-CM | POA: Diagnosis not present

## 2020-02-07 DIAGNOSIS — E039 Hypothyroidism, unspecified: Secondary | ICD-10-CM | POA: Diagnosis not present

## 2020-02-07 DIAGNOSIS — M8580 Other specified disorders of bone density and structure, unspecified site: Secondary | ICD-10-CM | POA: Diagnosis not present

## 2020-02-07 DIAGNOSIS — Z0000 Encounter for general adult medical examination without abnormal findings: Secondary | ICD-10-CM | POA: Diagnosis not present

## 2020-02-07 DIAGNOSIS — M1990 Unspecified osteoarthritis, unspecified site: Secondary | ICD-10-CM | POA: Diagnosis not present

## 2020-02-07 DIAGNOSIS — E559 Vitamin D deficiency, unspecified: Secondary | ICD-10-CM | POA: Diagnosis not present

## 2020-02-07 DIAGNOSIS — E785 Hyperlipidemia, unspecified: Secondary | ICD-10-CM | POA: Diagnosis not present

## 2020-02-27 DIAGNOSIS — I10 Essential (primary) hypertension: Secondary | ICD-10-CM | POA: Diagnosis not present

## 2020-02-27 DIAGNOSIS — E7800 Pure hypercholesterolemia, unspecified: Secondary | ICD-10-CM | POA: Diagnosis not present

## 2020-02-27 DIAGNOSIS — E1165 Type 2 diabetes mellitus with hyperglycemia: Secondary | ICD-10-CM | POA: Diagnosis not present

## 2020-02-27 DIAGNOSIS — D509 Iron deficiency anemia, unspecified: Secondary | ICD-10-CM | POA: Diagnosis not present

## 2020-02-27 DIAGNOSIS — E119 Type 2 diabetes mellitus without complications: Secondary | ICD-10-CM | POA: Diagnosis not present

## 2020-02-27 DIAGNOSIS — M8580 Other specified disorders of bone density and structure, unspecified site: Secondary | ICD-10-CM | POA: Diagnosis not present

## 2020-02-27 DIAGNOSIS — E039 Hypothyroidism, unspecified: Secondary | ICD-10-CM | POA: Diagnosis not present

## 2020-02-27 DIAGNOSIS — I4891 Unspecified atrial fibrillation: Secondary | ICD-10-CM | POA: Diagnosis not present

## 2020-02-27 DIAGNOSIS — M1990 Unspecified osteoarthritis, unspecified site: Secondary | ICD-10-CM | POA: Diagnosis not present

## 2020-02-27 DIAGNOSIS — E785 Hyperlipidemia, unspecified: Secondary | ICD-10-CM | POA: Diagnosis not present

## 2020-03-05 DIAGNOSIS — Z23 Encounter for immunization: Secondary | ICD-10-CM | POA: Diagnosis not present

## 2020-03-21 DIAGNOSIS — Z961 Presence of intraocular lens: Secondary | ICD-10-CM | POA: Diagnosis not present

## 2020-03-21 DIAGNOSIS — E119 Type 2 diabetes mellitus without complications: Secondary | ICD-10-CM | POA: Diagnosis not present

## 2020-03-21 DIAGNOSIS — H5213 Myopia, bilateral: Secondary | ICD-10-CM | POA: Diagnosis not present

## 2020-04-25 DIAGNOSIS — I10 Essential (primary) hypertension: Secondary | ICD-10-CM | POA: Diagnosis not present

## 2020-04-25 DIAGNOSIS — E1165 Type 2 diabetes mellitus with hyperglycemia: Secondary | ICD-10-CM | POA: Diagnosis not present

## 2020-04-25 DIAGNOSIS — E785 Hyperlipidemia, unspecified: Secondary | ICD-10-CM | POA: Diagnosis not present

## 2020-04-25 DIAGNOSIS — M8580 Other specified disorders of bone density and structure, unspecified site: Secondary | ICD-10-CM | POA: Diagnosis not present

## 2020-04-25 DIAGNOSIS — E7800 Pure hypercholesterolemia, unspecified: Secondary | ICD-10-CM | POA: Diagnosis not present

## 2020-04-25 DIAGNOSIS — E119 Type 2 diabetes mellitus without complications: Secondary | ICD-10-CM | POA: Diagnosis not present

## 2020-04-25 DIAGNOSIS — E039 Hypothyroidism, unspecified: Secondary | ICD-10-CM | POA: Diagnosis not present

## 2020-04-25 DIAGNOSIS — D509 Iron deficiency anemia, unspecified: Secondary | ICD-10-CM | POA: Diagnosis not present

## 2020-04-25 DIAGNOSIS — I4891 Unspecified atrial fibrillation: Secondary | ICD-10-CM | POA: Diagnosis not present

## 2020-04-25 DIAGNOSIS — M1990 Unspecified osteoarthritis, unspecified site: Secondary | ICD-10-CM | POA: Diagnosis not present

## 2020-05-31 DIAGNOSIS — E1165 Type 2 diabetes mellitus with hyperglycemia: Secondary | ICD-10-CM | POA: Diagnosis not present

## 2020-05-31 DIAGNOSIS — E7800 Pure hypercholesterolemia, unspecified: Secondary | ICD-10-CM | POA: Diagnosis not present

## 2020-05-31 DIAGNOSIS — E039 Hypothyroidism, unspecified: Secondary | ICD-10-CM | POA: Diagnosis not present

## 2020-05-31 DIAGNOSIS — E119 Type 2 diabetes mellitus without complications: Secondary | ICD-10-CM | POA: Diagnosis not present

## 2020-05-31 DIAGNOSIS — I10 Essential (primary) hypertension: Secondary | ICD-10-CM | POA: Diagnosis not present

## 2020-05-31 DIAGNOSIS — I4891 Unspecified atrial fibrillation: Secondary | ICD-10-CM | POA: Diagnosis not present

## 2020-05-31 DIAGNOSIS — D509 Iron deficiency anemia, unspecified: Secondary | ICD-10-CM | POA: Diagnosis not present

## 2020-05-31 DIAGNOSIS — M8580 Other specified disorders of bone density and structure, unspecified site: Secondary | ICD-10-CM | POA: Diagnosis not present

## 2020-05-31 DIAGNOSIS — M1990 Unspecified osteoarthritis, unspecified site: Secondary | ICD-10-CM | POA: Diagnosis not present

## 2020-05-31 DIAGNOSIS — E785 Hyperlipidemia, unspecified: Secondary | ICD-10-CM | POA: Diagnosis not present

## 2020-06-20 DIAGNOSIS — L718 Other rosacea: Secondary | ICD-10-CM | POA: Diagnosis not present

## 2020-06-20 DIAGNOSIS — L309 Dermatitis, unspecified: Secondary | ICD-10-CM | POA: Diagnosis not present

## 2020-07-19 DIAGNOSIS — E785 Hyperlipidemia, unspecified: Secondary | ICD-10-CM | POA: Diagnosis not present

## 2020-07-19 DIAGNOSIS — E119 Type 2 diabetes mellitus without complications: Secondary | ICD-10-CM | POA: Diagnosis not present

## 2020-07-19 DIAGNOSIS — I4891 Unspecified atrial fibrillation: Secondary | ICD-10-CM | POA: Diagnosis not present

## 2020-07-19 DIAGNOSIS — I129 Hypertensive chronic kidney disease with stage 1 through stage 4 chronic kidney disease, or unspecified chronic kidney disease: Secondary | ICD-10-CM | POA: Diagnosis not present

## 2020-07-23 DIAGNOSIS — D509 Iron deficiency anemia, unspecified: Secondary | ICD-10-CM | POA: Diagnosis not present

## 2020-07-23 DIAGNOSIS — M1990 Unspecified osteoarthritis, unspecified site: Secondary | ICD-10-CM | POA: Diagnosis not present

## 2020-07-23 DIAGNOSIS — E7800 Pure hypercholesterolemia, unspecified: Secondary | ICD-10-CM | POA: Diagnosis not present

## 2020-07-23 DIAGNOSIS — I4891 Unspecified atrial fibrillation: Secondary | ICD-10-CM | POA: Diagnosis not present

## 2020-07-23 DIAGNOSIS — M8580 Other specified disorders of bone density and structure, unspecified site: Secondary | ICD-10-CM | POA: Diagnosis not present

## 2020-07-23 DIAGNOSIS — I10 Essential (primary) hypertension: Secondary | ICD-10-CM | POA: Diagnosis not present

## 2020-07-23 DIAGNOSIS — E785 Hyperlipidemia, unspecified: Secondary | ICD-10-CM | POA: Diagnosis not present

## 2020-07-23 DIAGNOSIS — E039 Hypothyroidism, unspecified: Secondary | ICD-10-CM | POA: Diagnosis not present

## 2020-07-23 DIAGNOSIS — E1165 Type 2 diabetes mellitus with hyperglycemia: Secondary | ICD-10-CM | POA: Diagnosis not present

## 2020-07-31 DIAGNOSIS — E7800 Pure hypercholesterolemia, unspecified: Secondary | ICD-10-CM | POA: Diagnosis not present

## 2020-07-31 DIAGNOSIS — I10 Essential (primary) hypertension: Secondary | ICD-10-CM | POA: Diagnosis not present

## 2020-07-31 DIAGNOSIS — D509 Iron deficiency anemia, unspecified: Secondary | ICD-10-CM | POA: Diagnosis not present

## 2020-07-31 DIAGNOSIS — E119 Type 2 diabetes mellitus without complications: Secondary | ICD-10-CM | POA: Diagnosis not present

## 2020-09-12 DIAGNOSIS — M8580 Other specified disorders of bone density and structure, unspecified site: Secondary | ICD-10-CM | POA: Diagnosis not present

## 2020-09-12 DIAGNOSIS — I4891 Unspecified atrial fibrillation: Secondary | ICD-10-CM | POA: Diagnosis not present

## 2020-09-12 DIAGNOSIS — M1990 Unspecified osteoarthritis, unspecified site: Secondary | ICD-10-CM | POA: Diagnosis not present

## 2020-09-12 DIAGNOSIS — I10 Essential (primary) hypertension: Secondary | ICD-10-CM | POA: Diagnosis not present

## 2020-09-12 DIAGNOSIS — E119 Type 2 diabetes mellitus without complications: Secondary | ICD-10-CM | POA: Diagnosis not present

## 2020-09-12 DIAGNOSIS — D509 Iron deficiency anemia, unspecified: Secondary | ICD-10-CM | POA: Diagnosis not present

## 2020-09-12 DIAGNOSIS — E1165 Type 2 diabetes mellitus with hyperglycemia: Secondary | ICD-10-CM | POA: Diagnosis not present

## 2020-09-12 DIAGNOSIS — E7800 Pure hypercholesterolemia, unspecified: Secondary | ICD-10-CM | POA: Diagnosis not present

## 2020-09-12 DIAGNOSIS — E039 Hypothyroidism, unspecified: Secondary | ICD-10-CM | POA: Diagnosis not present

## 2020-09-12 DIAGNOSIS — E785 Hyperlipidemia, unspecified: Secondary | ICD-10-CM | POA: Diagnosis not present

## 2020-09-20 DIAGNOSIS — L820 Inflamed seborrheic keratosis: Secondary | ICD-10-CM | POA: Diagnosis not present

## 2020-09-20 DIAGNOSIS — L821 Other seborrheic keratosis: Secondary | ICD-10-CM | POA: Diagnosis not present

## 2020-10-09 DIAGNOSIS — L309 Dermatitis, unspecified: Secondary | ICD-10-CM | POA: Diagnosis not present

## 2020-10-09 DIAGNOSIS — L0889 Other specified local infections of the skin and subcutaneous tissue: Secondary | ICD-10-CM | POA: Diagnosis not present

## 2020-11-14 ENCOUNTER — Other Ambulatory Visit: Payer: Self-pay | Admitting: Internal Medicine

## 2020-11-14 DIAGNOSIS — Z1231 Encounter for screening mammogram for malignant neoplasm of breast: Secondary | ICD-10-CM

## 2020-11-20 DIAGNOSIS — D509 Iron deficiency anemia, unspecified: Secondary | ICD-10-CM | POA: Diagnosis not present

## 2020-11-20 DIAGNOSIS — E119 Type 2 diabetes mellitus without complications: Secondary | ICD-10-CM | POA: Diagnosis not present

## 2020-11-20 DIAGNOSIS — E1165 Type 2 diabetes mellitus with hyperglycemia: Secondary | ICD-10-CM | POA: Diagnosis not present

## 2020-11-20 DIAGNOSIS — I10 Essential (primary) hypertension: Secondary | ICD-10-CM | POA: Diagnosis not present

## 2020-11-20 DIAGNOSIS — E785 Hyperlipidemia, unspecified: Secondary | ICD-10-CM | POA: Diagnosis not present

## 2020-11-20 DIAGNOSIS — E039 Hypothyroidism, unspecified: Secondary | ICD-10-CM | POA: Diagnosis not present

## 2020-11-20 DIAGNOSIS — M1990 Unspecified osteoarthritis, unspecified site: Secondary | ICD-10-CM | POA: Diagnosis not present

## 2020-11-20 DIAGNOSIS — I4891 Unspecified atrial fibrillation: Secondary | ICD-10-CM | POA: Diagnosis not present

## 2020-11-20 DIAGNOSIS — M8580 Other specified disorders of bone density and structure, unspecified site: Secondary | ICD-10-CM | POA: Diagnosis not present

## 2020-11-20 DIAGNOSIS — E7800 Pure hypercholesterolemia, unspecified: Secondary | ICD-10-CM | POA: Diagnosis not present

## 2020-11-22 DIAGNOSIS — L814 Other melanin hyperpigmentation: Secondary | ICD-10-CM | POA: Diagnosis not present

## 2020-11-22 DIAGNOSIS — L570 Actinic keratosis: Secondary | ICD-10-CM | POA: Diagnosis not present

## 2020-11-22 DIAGNOSIS — D1801 Hemangioma of skin and subcutaneous tissue: Secondary | ICD-10-CM | POA: Diagnosis not present

## 2020-11-22 DIAGNOSIS — L820 Inflamed seborrheic keratosis: Secondary | ICD-10-CM | POA: Diagnosis not present

## 2020-11-22 DIAGNOSIS — L821 Other seborrheic keratosis: Secondary | ICD-10-CM | POA: Diagnosis not present

## 2020-11-22 DIAGNOSIS — D2222 Melanocytic nevi of left ear and external auricular canal: Secondary | ICD-10-CM | POA: Diagnosis not present

## 2020-11-22 DIAGNOSIS — D485 Neoplasm of uncertain behavior of skin: Secondary | ICD-10-CM | POA: Diagnosis not present

## 2020-12-25 DIAGNOSIS — H26493 Other secondary cataract, bilateral: Secondary | ICD-10-CM | POA: Diagnosis not present

## 2021-01-07 ENCOUNTER — Other Ambulatory Visit: Payer: Self-pay

## 2021-01-07 ENCOUNTER — Ambulatory Visit
Admission: RE | Admit: 2021-01-07 | Discharge: 2021-01-07 | Disposition: A | Discharge status: Home / Self Care | Payer: Medicare Other | Source: Ambulatory Visit | Attending: Internal Medicine | Admitting: Internal Medicine

## 2021-01-07 DIAGNOSIS — Z1231 Encounter for screening mammogram for malignant neoplasm of breast: Secondary | ICD-10-CM | POA: Diagnosis not present

## 2021-01-15 DIAGNOSIS — M8580 Other specified disorders of bone density and structure, unspecified site: Secondary | ICD-10-CM | POA: Diagnosis not present

## 2021-01-15 DIAGNOSIS — I10 Essential (primary) hypertension: Secondary | ICD-10-CM | POA: Diagnosis not present

## 2021-01-15 DIAGNOSIS — E039 Hypothyroidism, unspecified: Secondary | ICD-10-CM | POA: Diagnosis not present

## 2021-01-15 DIAGNOSIS — E7800 Pure hypercholesterolemia, unspecified: Secondary | ICD-10-CM | POA: Diagnosis not present

## 2021-01-15 DIAGNOSIS — I4891 Unspecified atrial fibrillation: Secondary | ICD-10-CM | POA: Diagnosis not present

## 2021-01-15 DIAGNOSIS — E1165 Type 2 diabetes mellitus with hyperglycemia: Secondary | ICD-10-CM | POA: Diagnosis not present

## 2021-01-15 DIAGNOSIS — M1990 Unspecified osteoarthritis, unspecified site: Secondary | ICD-10-CM | POA: Diagnosis not present

## 2021-01-15 DIAGNOSIS — E785 Hyperlipidemia, unspecified: Secondary | ICD-10-CM | POA: Diagnosis not present

## 2021-01-15 DIAGNOSIS — D509 Iron deficiency anemia, unspecified: Secondary | ICD-10-CM | POA: Diagnosis not present

## 2021-01-15 DIAGNOSIS — E119 Type 2 diabetes mellitus without complications: Secondary | ICD-10-CM | POA: Diagnosis not present

## 2021-02-12 DIAGNOSIS — E039 Hypothyroidism, unspecified: Secondary | ICD-10-CM | POA: Diagnosis not present

## 2021-02-12 DIAGNOSIS — M8580 Other specified disorders of bone density and structure, unspecified site: Secondary | ICD-10-CM | POA: Diagnosis not present

## 2021-02-12 DIAGNOSIS — E785 Hyperlipidemia, unspecified: Secondary | ICD-10-CM | POA: Diagnosis not present

## 2021-02-12 DIAGNOSIS — D509 Iron deficiency anemia, unspecified: Secondary | ICD-10-CM | POA: Diagnosis not present

## 2021-02-12 DIAGNOSIS — Z7984 Long term (current) use of oral hypoglycemic drugs: Secondary | ICD-10-CM | POA: Diagnosis not present

## 2021-02-12 DIAGNOSIS — Z1389 Encounter for screening for other disorder: Secondary | ICD-10-CM | POA: Diagnosis not present

## 2021-02-12 DIAGNOSIS — I10 Essential (primary) hypertension: Secondary | ICD-10-CM | POA: Diagnosis not present

## 2021-02-12 DIAGNOSIS — J309 Allergic rhinitis, unspecified: Secondary | ICD-10-CM | POA: Diagnosis not present

## 2021-02-12 DIAGNOSIS — M1990 Unspecified osteoarthritis, unspecified site: Secondary | ICD-10-CM | POA: Diagnosis not present

## 2021-02-12 DIAGNOSIS — E559 Vitamin D deficiency, unspecified: Secondary | ICD-10-CM | POA: Diagnosis not present

## 2021-02-12 DIAGNOSIS — E1165 Type 2 diabetes mellitus with hyperglycemia: Secondary | ICD-10-CM | POA: Diagnosis not present

## 2021-02-12 DIAGNOSIS — Z23 Encounter for immunization: Secondary | ICD-10-CM | POA: Diagnosis not present

## 2021-02-12 DIAGNOSIS — Z79899 Other long term (current) drug therapy: Secondary | ICD-10-CM | POA: Diagnosis not present

## 2021-02-12 DIAGNOSIS — Z0000 Encounter for general adult medical examination without abnormal findings: Secondary | ICD-10-CM | POA: Diagnosis not present

## 2021-02-12 DIAGNOSIS — L820 Inflamed seborrheic keratosis: Secondary | ICD-10-CM | POA: Diagnosis not present

## 2021-02-14 DIAGNOSIS — N3281 Overactive bladder: Secondary | ICD-10-CM | POA: Diagnosis not present

## 2021-03-27 DIAGNOSIS — E119 Type 2 diabetes mellitus without complications: Secondary | ICD-10-CM | POA: Diagnosis not present

## 2021-03-27 DIAGNOSIS — Z961 Presence of intraocular lens: Secondary | ICD-10-CM | POA: Diagnosis not present

## 2021-03-28 DIAGNOSIS — N3281 Overactive bladder: Secondary | ICD-10-CM | POA: Diagnosis not present

## 2021-03-28 DIAGNOSIS — N952 Postmenopausal atrophic vaginitis: Secondary | ICD-10-CM | POA: Diagnosis not present

## 2021-04-22 ENCOUNTER — Emergency Department (HOSPITAL_COMMUNITY): Payer: Medicare Other

## 2021-04-22 ENCOUNTER — Emergency Department (HOSPITAL_COMMUNITY)
Admission: EM | Admit: 2021-04-22 | Discharge: 2021-04-22 | Disposition: A | Discharge status: Home / Self Care | Payer: Medicare Other | Attending: Emergency Medicine | Admitting: Emergency Medicine

## 2021-04-22 DIAGNOSIS — R112 Nausea with vomiting, unspecified: Secondary | ICD-10-CM | POA: Insufficient documentation

## 2021-04-22 DIAGNOSIS — R0902 Hypoxemia: Secondary | ICD-10-CM | POA: Diagnosis not present

## 2021-04-22 DIAGNOSIS — Z7984 Long term (current) use of oral hypoglycemic drugs: Secondary | ICD-10-CM | POA: Diagnosis not present

## 2021-04-22 DIAGNOSIS — R110 Nausea: Secondary | ICD-10-CM | POA: Diagnosis not present

## 2021-04-22 DIAGNOSIS — I480 Paroxysmal atrial fibrillation: Secondary | ICD-10-CM | POA: Diagnosis not present

## 2021-04-22 DIAGNOSIS — S0990XA Unspecified injury of head, initial encounter: Secondary | ICD-10-CM | POA: Diagnosis not present

## 2021-04-22 DIAGNOSIS — R42 Dizziness and giddiness: Secondary | ICD-10-CM | POA: Diagnosis not present

## 2021-04-22 DIAGNOSIS — R1111 Vomiting without nausea: Secondary | ICD-10-CM | POA: Diagnosis not present

## 2021-04-22 DIAGNOSIS — E119 Type 2 diabetes mellitus without complications: Secondary | ICD-10-CM | POA: Diagnosis not present

## 2021-04-22 DIAGNOSIS — I10 Essential (primary) hypertension: Secondary | ICD-10-CM | POA: Diagnosis not present

## 2021-04-22 LAB — CBC
HCT: 46 % (ref 36.0–46.0)
Hemoglobin: 14.2 g/dL (ref 12.0–15.0)
MCH: 27.2 pg (ref 26.0–34.0)
MCHC: 30.9 g/dL (ref 30.0–36.0)
MCV: 88.1 fL (ref 80.0–100.0)
Platelets: 251 10*3/uL (ref 150–400)
RBC: 5.22 MIL/uL — ABNORMAL HIGH (ref 3.87–5.11)
RDW: 13.5 % (ref 11.5–15.5)
WBC: 8.1 10*3/uL (ref 4.0–10.5)
nRBC: 0 % (ref 0.0–0.2)

## 2021-04-22 LAB — URINALYSIS, ROUTINE W REFLEX MICROSCOPIC
Bacteria, UA: NONE SEEN
Bilirubin Urine: NEGATIVE
Glucose, UA: >=500 mg/dL — AB
Hgb urine dipstick: NEGATIVE
Ketones, ur: 5 mg/dL — AB
Nitrite: NEGATIVE
Protein, ur: NEGATIVE mg/dL
Specific Gravity, Urine: 1.026 (ref 1.005–1.030)
pH: 5 (ref 5.0–8.0)

## 2021-04-22 LAB — CBG MONITORING, ED: Glucose-Capillary: 154 mg/dL — ABNORMAL HIGH (ref 70–99)

## 2021-04-22 LAB — BASIC METABOLIC PANEL
Anion gap: 9 (ref 5–15)
BUN: 22 mg/dL (ref 8–23)
CO2: 24 mmol/L (ref 22–32)
Calcium: 9.5 mg/dL (ref 8.9–10.3)
Chloride: 107 mmol/L (ref 98–111)
Creatinine, Ser: 0.8 mg/dL (ref 0.44–1.00)
GFR, Estimated: >60 mL/min (ref >60)
Glucose, Bld: 175 mg/dL — ABNORMAL HIGH (ref 70–99)
Potassium: 4.1 mmol/L (ref 3.5–5.1)
Sodium: 140 mmol/L (ref 135–145)

## 2021-04-22 MED ORDER — MECLIZINE HCL 25 MG PO TABS
25.0000 mg | ORAL_TABLET | Freq: Once | ORAL | Status: AC
  Administered 2021-04-22: 25 mg via ORAL
  Filled 2021-04-22: qty 1

## 2021-04-22 MED ORDER — MECLIZINE HCL 25 MG PO TABS: 25.0000 mg | ORAL_TABLET | Freq: Three times a day (TID) | ORAL | 0 refills | Status: AC | PRN

## 2021-04-22 NOTE — ED Provider Notes (Signed)
[facility] EMERGENCY DEPARTMENT Provider Note   CSN: [id number] Arrival date & time: 04/22/21  4503     History Chief Complaint  Patient presents with   Dizziness   Emesis   Nausea    Brenda Butler is a 75 y.o. female.  HPI  75 year old female with past medical history of DM, mitral valve regurg, vasovagal syncope, paroxysmal atrial fibrillation attributed to acute illness currently not anticoagulated presents the emergency department after feeling a whooshing sensation in her head.  A couple weeks ago patient had a mechanical fall and hit the right side of her head.  She states that she had a large goose egg, bruising to the right side of the face, never got checked out.  2 days ago on Friday patient started to feel unwell while eating dinner.  Did not have any chest pain, shortness of breath or palpitations.  No syncope.  This morning at 5 AM when she woke up she rolled over onto her left side and describes a wave like whooshing sensation that started from the right side of her head to the left side of her head.  This lasted briefly a couple seconds.  She states that she "went out" which she describes as brief black vision that quickly resolved.  No known unconscious state.  Patient states that she got up at that time, ate some breakfast, took her morning meds.  She had some mild nausea, took Zofran and went and laid back down in bed.  When she rolled over to the left side she again had this whooshing sensation and called EMS.  Patient states this did not feel like her previous vasovagal episodes.  She denies any palpitations or arrhythmias.  No full syncope or seizure-like activity.  No ongoing lightheadedness or neuro symptoms.  Past Medical History:  Diagnosis Date   Diabetes mellitus without complication (HCC)    Mitral regurgitation    Mitral valve prolapse    Palpitations    a. known history of PAT. b. admitted for vasovagal syncope in 09/2016 and noted to  have PAF --> started on anticoagulation.    Syncope and collapse    Vasovagal syncope    a. occuring since childhood per the patient's report.     Patient Active Problem List   Diagnosis Date Noted   Paroxysmal atrial fibrillation ([place]) 10/06/2016   PAT (paroxysmal atrial tachycardia) ([facility]) 10/06/2016   Chronic anticoagulation 10/06/2016   Syncope, vasovagal 10/04/2016   Chest pain 10/04/2016   Syncope 10/03/2016   Heart palpitations 06/18/2013   Anemia, iron deficiency 06/18/2013    No past surgical history on file.   OB History   No obstetric history on file.     Family History  Problem Relation Age of Onset   Breast cancer Sister 48   Arrhythmia Mother        atrial fib   CVA Father     Social History   Tobacco Use   Smoking status: Never   Smokeless tobacco: Never  Vaping Use   Vaping Use: Never used  Substance Use Topics   Alcohol use: No   Drug use: No    Home Medications Prior to Admission medications   Medication Sig Start Date End Date Taking? Authorizing Provider  metoprolol succinate (TOPROL-XL) 25 MG 24 hr tablet Take 25 mg by mouth in the morning and at bedtime. 12/11/20  Yes [provider]  acetaminophen (TYLENOL) 500 MG tablet Take 500 mg by mouth every  6 (six) hours as needed for mild pain.    [provider]  Cholecalciferol (VITAMIN D) 2000 UNITS tablet Take 2,000 Units by mouth daily.    [provider]  diphenoxylate-atropine (LOMOTIL) 2.5-0.025 MG tablet Take 1 tablet by mouth 4 (four) times daily as needed for diarrhea or loose stools. 05/16/16   Tanna Furry, MD  doxepin (SINEQUAN) 10 MG capsule Take 10 mg by mouth at bedtime as needed (ITCHING).     [provider]  doxycycline (MONODOX) 50 MG capsule Take 50 mg by mouth 2 (two) times daily. 04/16/21   [provider]  estradiol (ESTRACE) 0.1 MG/GM vaginal cream Place vaginally. 02/14/21   [provider]  ferrous sulfate 325 (65 FE) MG  tablet Take 325 mg by mouth daily with breakfast.    [provider]  JARDIANCE 25 MG TABS tablet Take 25 mg by mouth daily. 03/05/21   [provider]  metFORMIN (GLUCOPHAGE-XR) 500 MG 24 hr tablet Take 1 tablet by mouth daily. 09/16/16   [provider]  metoprolol succinate (TOPROL-XL) 25 MG 24 hr tablet Take 25 mg by mouth daily. 02/06/17   [provider]  ondansetron (ZOFRAN ODT) 4 MG disintegrating tablet Take 1 tablet (4 mg total) by mouth every 8 (eight) hours as needed for nausea. 05/16/16   Tanna Furry, MD  rosuvastatin (CRESTOR) 10 MG tablet Take 10 mg by mouth 3 (three) times a week.     [provider]  rosuvastatin (CRESTOR) 5 MG tablet Take 5 mg by mouth 3 (three) times a week. 03/15/21   [provider]  spironolactone (ALDACTONE) 25 MG tablet Take 12.5 mg by mouth daily. 01/26/17   [provider]  valACYclovir (VALTREX) 1000 MG tablet  06/18/17   [provider]    Allergies    Penicillins, Codeine, Colchicine, Iron, and Metformin and related  Review of Systems   Review of Systems  Constitutional:  Positive for fatigue. Negative for chills and fever.  HENT:  Negative for congestion.   Eyes:  Negative for visual disturbance.  Respiratory:  Negative for shortness of breath.   Cardiovascular:  Negative for chest pain.  Gastrointestinal:  Positive for nausea. Negative for abdominal pain, diarrhea and vomiting.  Genitourinary:  Negative for dysuria.  Skin:  Negative for rash.  Neurological:  Positive for light-headedness. Negative for dizziness, tremors, seizures, facial asymmetry, speech difficulty, weakness, numbness and headaches.   Physical Exam Updated Vital Signs BP 135/61   Pulse 67   Temp 98.2 F (36.8 C) (Oral)   Resp (!) 21   SpO2 95%   Physical Exam Vitals and nursing note reviewed.  Constitutional:      General: She is not in acute distress.    Appearance: Normal appearance. She is not  diaphoretic.  HENT:     Head: Normocephalic.     Mouth/Throat:     Mouth: Mucous membranes are moist.  Cardiovascular:     Rate and Rhythm: Normal rate and regular rhythm.  Pulmonary:     Effort: Pulmonary effort is normal. No respiratory distress.  Abdominal:     Palpations: Abdomen is soft.     Tenderness: There is no abdominal tenderness.  Skin:    General: Skin is warm.  Neurological:     General: No focal deficit present.     Mental Status: She is alert and oriented to person, place, and time. Mental status is at baseline.     Cranial Nerves: No cranial  nerve deficit.     Motor: No weakness.  Psychiatric:        Mood and Affect: Mood normal.    ED Results / Procedures / Treatments   Labs (all labs ordered are listed, but only abnormal results are displayed) Labs Reviewed  BASIC METABOLIC PANEL - Abnormal; Notable for the following components:      Result Value   Glucose, Bld 175 (*)    All other components within normal limits  CBC - Abnormal; Notable for the following components:   RBC 5.22 (*)    All other components within normal limits  URINALYSIS, ROUTINE W REFLEX MICROSCOPIC - Abnormal; Notable for the following components:   Color, Urine STRAW (*)    Glucose, UA >=500 (*)    Ketones, ur 5 (*)    Leukocytes,Ua TRACE (*)    All other components within normal limits  CBG MONITORING, ED - Abnormal; Notable for the following components:   Glucose-Capillary 154 (*)    All other components within normal limits    EKG EKG Interpretation  Date/Time:  Monday April 22 2021 07:54:41 EST Ventricular Rate:  71 PR Interval:  176 QRS Duration: 76 QT Interval:  412 QTC Calculation: 447 R Axis:   40 Text Interpretation: Normal sinus rhythm Cannot rule out Anterior infarct , age undetermined Abnormal ECG NSR Confirmed by Lavenia Atlas [phone]) on 04/22/2021 9:24:46 AM  Radiology No results found.  Procedures Procedures   Medications Ordered in  ED Medications - No data to display  ED Course  I have reviewed the triage vital signs and the nursing notes.  Pertinent labs & imaging results that were available during my care of the patient were reviewed by me and considered in my medical decision making (see chart for details).    MDM Rules/Calculators/A&P                           75 year old female presents the emergency department after isolated episodes of what she describes as a whooshing sensation in her head that happens when she abruptly turns her head.  Currently at rest she is asymptomatic.  No associated chest pain, syncope, neurodeficit.  Vitals are stable on arrival.  She is neuro intact and well-appearing.  No headache or neck pain.  Blood work is reassuring, EKG is normal sinus rhythm, review of telemetry shows no paroxysmal arrhythmias.  Head CT shows no acute finding.  Patient was treated with meclizine and feels improved.  She had 1 additional episode while here, was brief and self resolved.  Again related to quick head movement.  Again sounds vertiginous, low suspicion for CVA/posterior circulation pathology.  Educated the patient on symptoms, she will follow-up with her primary doctor and ENT.  Patient at this time appears safe and stable for discharge and will be treated as an outpatient.  Discharge plan and strict return to ED precautions discussed, patient verbalizes understanding and agreement.  Final Clinical Impression(s) / ED Diagnoses Final diagnoses:  None    Rx / DC Orders ED Discharge Orders     None        Lorelle Gibbs, DO 04/22/21 1520

## 2021-04-22 NOTE — ED Notes (Signed)
Pt. Stated, I fell about a month ago and hit a rock on my heada and my Dr. Rockey Situ me just to watch it.

## 2021-04-22 NOTE — ED Notes (Signed)
Patient transported to CT 

## 2021-04-22 NOTE — ED Notes (Signed)
Pt verbalizes understanding of discharge instructions. Opportunity for questions and answers were provided. Pt discharged from the ED.   ?

## 2021-04-22 NOTE — ED Triage Notes (Signed)
EMS stated, she woke up at 500 c/o N/V and dizziness. Orthostatics -Neg.

## 2021-04-22 NOTE — ED Notes (Signed)
Pt ambulatory to RR without difficulty. Denies complaints.

## 2021-04-22 NOTE — Discharge Instructions (Addendum)
You have been seen and discharged from the emergency department.  I believe you are suffering from vertigo symptoms.  Your blood work was otherwise normal, head CT showed no acute finding.  Take meclizine as directed, stay well-hydrated.  Follow-up with your primary provider for reevaluation and further care.  You may also establish care with ENT for full evaluation.  Take home medications as prescribed. If you have any worsening symptoms, persistent dizziness, headache, speech changes, focal weakness/numbness, strokelike symptoms or further concerns for your health please return to an emergency department for further evaluation.

## 2021-04-22 NOTE — ED Notes (Signed)
Pt called out on call light, stating she was having another episode, feeling as if the room was spinning after she turned her head. [place], MD aware.

## 2021-04-22 NOTE — ED Triage Notes (Signed)
Pt. Stated, I passed out twice in the bed. This happened twice. I was dizzy.

## 2021-04-22 NOTE — ED Triage Notes (Signed)
Pt. Stated, I have vaso syncope

## 2021-07-17 DIAGNOSIS — E119 Type 2 diabetes mellitus without complications: Secondary | ICD-10-CM | POA: Diagnosis not present

## 2021-07-17 DIAGNOSIS — I4891 Unspecified atrial fibrillation: Secondary | ICD-10-CM | POA: Diagnosis not present

## 2021-07-17 DIAGNOSIS — I129 Hypertensive chronic kidney disease with stage 1 through stage 4 chronic kidney disease, or unspecified chronic kidney disease: Secondary | ICD-10-CM | POA: Diagnosis not present

## 2021-07-17 DIAGNOSIS — E785 Hyperlipidemia, unspecified: Secondary | ICD-10-CM | POA: Diagnosis not present

## 2021-11-25 DIAGNOSIS — M7130 Other bursal cyst, unspecified site: Secondary | ICD-10-CM | POA: Diagnosis not present

## 2021-11-25 DIAGNOSIS — D2272 Melanocytic nevi of left lower limb, including hip: Secondary | ICD-10-CM | POA: Diagnosis not present

## 2021-11-25 DIAGNOSIS — L720 Epidermal cyst: Secondary | ICD-10-CM | POA: Diagnosis not present

## 2021-11-25 DIAGNOSIS — D1801 Hemangioma of skin and subcutaneous tissue: Secondary | ICD-10-CM | POA: Diagnosis not present

## 2021-11-25 DIAGNOSIS — D225 Melanocytic nevi of trunk: Secondary | ICD-10-CM | POA: Diagnosis not present

## 2021-11-25 DIAGNOSIS — L820 Inflamed seborrheic keratosis: Secondary | ICD-10-CM | POA: Diagnosis not present

## 2021-11-25 DIAGNOSIS — L821 Other seborrheic keratosis: Secondary | ICD-10-CM | POA: Diagnosis not present

## 2021-11-25 DIAGNOSIS — D2271 Melanocytic nevi of right lower limb, including hip: Secondary | ICD-10-CM | POA: Diagnosis not present

## 2021-12-03 ENCOUNTER — Other Ambulatory Visit: Payer: Self-pay | Admitting: Internal Medicine

## 2021-12-03 DIAGNOSIS — Z1231 Encounter for screening mammogram for malignant neoplasm of breast: Secondary | ICD-10-CM

## 2022-01-01 ENCOUNTER — Ambulatory Visit: Payer: Medicare Other | Admitting: Podiatry

## 2022-01-02 ENCOUNTER — Ambulatory Visit: Payer: Medicare Other

## 2022-01-02 ENCOUNTER — Ambulatory Visit (INDEPENDENT_AMBULATORY_CARE_PROVIDER_SITE_OTHER): Payer: Medicare Other | Admitting: Podiatry

## 2022-01-02 DIAGNOSIS — Z01818 Encounter for other preprocedural examination: Secondary | ICD-10-CM

## 2022-01-02 DIAGNOSIS — L723 Sebaceous cyst: Secondary | ICD-10-CM | POA: Diagnosis not present

## 2022-01-02 DIAGNOSIS — M6740 Ganglion, unspecified site: Secondary | ICD-10-CM | POA: Diagnosis not present

## 2022-01-06 ENCOUNTER — Telehealth: Payer: Self-pay

## 2022-01-06 NOTE — Telephone Encounter (Signed)
Brenda Butler called and stated she has decided not to proceed with the office surgery with Dr. Posey Pronto. She stated it is not hurting at this time. I told her to let me know if something changes and we will then get everything rescheduled.

## 2022-01-08 ENCOUNTER — Ambulatory Visit
Admission: RE | Admit: 2022-01-08 | Discharge: 2022-01-08 | Disposition: A | Discharge status: Home / Self Care | Payer: Medicare Other | Source: Ambulatory Visit | Attending: Internal Medicine | Admitting: Internal Medicine

## 2022-01-08 DIAGNOSIS — Z1231 Encounter for screening mammogram for malignant neoplasm of breast: Secondary | ICD-10-CM

## 2022-01-09 NOTE — Progress Notes (Signed)
Subjective:  Patient ID: Brenda Butler, female    DOB: Mar 03, 1946,  MRN: [id number]  Chief Complaint  Patient presents with   Toe Pain    76 y.o. female presents with the above complaint.  Patient presents for right second digit mucoid cyst on the top of the foot.  Patient has been on for 3 weeks.  There are some pain discomfort associate with it.  She states she popped it a few times but it came back and is causing her some pain.  She would like to have it removed.  She has tried all conservative care including shoe gear modification popping and draining it none of which has helped.  She would like to discuss treatment options for this.  It hurts with ambulation pain scale is 5 out of 10 hurts with pressure on the shoe.  She is a diabetic with last A1c of that is controlled per patient.   Review of Systems: Negative except as noted in the HPI. Denies N/V/F/Ch.  Past Medical History:  Diagnosis Date   Diabetes mellitus without complication (HCC)    Mitral regurgitation    Mitral valve prolapse    Palpitations    a. known history of PAT. b. admitted for vasovagal syncope in 09/2016 and noted to have PAF --> started on anticoagulation.    Syncope and collapse    Vasovagal syncope    a. occuring since childhood per the patient's report.     Current Outpatient Medications:    acetaminophen (TYLENOL) 500 MG tablet, Take 500 mg by mouth every 6 (six) hours as needed for mild pain., Disp: , Rfl:    Cholecalciferol (VITAMIN D) 2000 UNITS tablet, Take 2,000 Units by mouth daily., Disp: , Rfl:    diphenoxylate-atropine (LOMOTIL) 2.5-0.025 MG tablet, Take 1 tablet by mouth 4 (four) times daily as needed for diarrhea or loose stools., Disp: 30 tablet, Rfl: 0   doxepin (SINEQUAN) 10 MG capsule, Take 10 mg by mouth at bedtime as needed (ITCHING). , Disp: , Rfl:    doxycycline (MONODOX) 50 MG capsule, Take 50 mg by mouth 2 (two) times daily. continuous, Disp: , Rfl:    ferrous sulfate 325 (65 FE)  MG tablet, Take 325 mg by mouth 3 (three) times a week. Mon. Wed. fri, Disp: , Rfl:    JARDIANCE 25 MG TABS tablet, Take 25 mg by mouth daily., Disp: , Rfl:    meclizine (ANTIVERT) 25 MG tablet, Take 1 tablet (25 mg total) by mouth 3 (three) times daily as needed for dizziness., Disp: 20 tablet, Rfl: 0   metFORMIN (GLUCOPHAGE-XR) 500 MG 24 hr tablet, Take 1,000-1,500 mg by mouth See admin instructions. 3 one night and 2 the next., Disp: , Rfl: 1   metoprolol succinate (TOPROL-XL) 25 MG 24 hr tablet, Take 12.5-25 mg by mouth See admin instructions. Sat. Sun. Tues, thurs. (12.'5mg'$ ) all other days take '25mg'$ , Disp: , Rfl:    ondansetron (ZOFRAN ODT) 4 MG disintegrating tablet, Take 1 tablet (4 mg total) by mouth every 8 (eight) hours as needed for nausea. (Patient taking differently: Take 8 mg by mouth every 8 (eight) hours as needed for nausea.), Disp: 6 tablet, Rfl: 0   rosuvastatin (CRESTOR) 5 MG tablet, Take 5 mg by mouth See admin instructions. Patient takes twice weekly-Saturday and Wednesday, Disp: , Rfl:    solifenacin (VESICARE) 5 MG tablet, Take 5 mg by mouth daily., Disp: , Rfl:    spironolactone (ALDACTONE) 25 MG tablet, Take 12.5 mg  by mouth daily., Disp: , Rfl:    valACYclovir (VALTREX) 1000 MG tablet, Take 1,000 mg by mouth 2 (two) times daily as needed (herpes)., Disp: , Rfl:   Social History   Tobacco Use  Smoking Status Never  Smokeless Tobacco Never    Allergies  Allergen Reactions   Penicillins Anaphylaxis and Nausea And Vomiting    Anaphylaxis and Nausea and vomiting   Tetanus-Diphtheria Toxoids Td     Other reaction(s): anaphylaxis   Codeine Nausea And Vomiting   Colchicine Other (See Comments)    Lightheadedness   Iron Nausea Only    nausea   Metformin And Related Diarrhea   Objective:  There were no vitals filed for this visit. There is no height or weight on file to calculate BMI. Constitutional Well developed. Well nourished.  Vascular Dorsalis pedis pulses  palpable bilaterally. Posterior tibial pulses palpable bilaterally. Capillary refill normal to all digits.  No cyanosis or clubbing noted. Pedal hair growth normal.  Neurologic Normal speech. Oriented to person, place, and time. Epicritic sensation to light touch grossly present bilaterally.  Dermatologic Right second digit mucoid cyst noted at the DIPJ joint.  Mild pain on palpation.  No involvement of the nail noted.  Hard indurated nodule noted  Orthopedic: Normal joint ROM without pain or crepitus bilaterally. No visible deformities. No bony tenderness.   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  No soft tissue mass noted.  No other bony abnormalities identified.  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 Assessment:   1. Mucoid cyst of joint   2. Encounter for preoperative examination for general surgical procedure    Plan:  Patient was evaluated and treated and all questions answered.  Right second toe mucoid cyst -All questions and concerns were discussed with the patient in extensive detail given the amount of cyst that is present and the amount of pain that is present with that I believe she will benefit from surgical excision of the cyst.  I discussed my preoperative postoperative plan in extensive detail.  Also discussed that it can inhibit growth of the nail as well.  She states understanding would like to proceed with excision of the mucoid cyst. -Informed surgical risk consent was reviewed and read aloud to the patient.  I reviewed the films.  I have discussed my findings with the patient in great detail.  I have discussed all risks including but not limited to infection, stiffness, scarring, limp, disability, deformity, damage to blood vessels and nerves, numbness, poor healing, need for braces, arthritis, chronic pain, amputation, death.  All benefits and realistic expectations discussed in  great detail.  I have made no promises as to the outcome.  I have provided realistic expectations.  I have offered the patient a 2nd opinion, which they have declined and assured me they preferred to proceed despite the risks   No follow-ups on file.

## 2022-02-14 DIAGNOSIS — E611 Iron deficiency: Secondary | ICD-10-CM | POA: Diagnosis not present

## 2022-02-14 DIAGNOSIS — Z23 Encounter for immunization: Secondary | ICD-10-CM | POA: Diagnosis not present

## 2022-02-14 DIAGNOSIS — I10 Essential (primary) hypertension: Secondary | ICD-10-CM | POA: Diagnosis not present

## 2022-02-14 DIAGNOSIS — E559 Vitamin D deficiency, unspecified: Secondary | ICD-10-CM | POA: Diagnosis not present

## 2022-02-14 DIAGNOSIS — Z79899 Other long term (current) drug therapy: Secondary | ICD-10-CM | POA: Diagnosis not present

## 2022-02-14 DIAGNOSIS — Z0000 Encounter for general adult medical examination without abnormal findings: Secondary | ICD-10-CM | POA: Diagnosis not present

## 2022-02-14 DIAGNOSIS — E1165 Type 2 diabetes mellitus with hyperglycemia: Secondary | ICD-10-CM | POA: Diagnosis not present

## 2022-02-14 DIAGNOSIS — E7800 Pure hypercholesterolemia, unspecified: Secondary | ICD-10-CM | POA: Diagnosis not present

## 2022-02-14 DIAGNOSIS — R311 Benign essential microscopic hematuria: Secondary | ICD-10-CM | POA: Diagnosis not present

## 2022-02-14 DIAGNOSIS — I480 Paroxysmal atrial fibrillation: Secondary | ICD-10-CM | POA: Diagnosis not present

## 2022-02-14 DIAGNOSIS — E669 Obesity, unspecified: Secondary | ICD-10-CM | POA: Diagnosis not present

## 2022-02-14 DIAGNOSIS — M8580 Other specified disorders of bone density and structure, unspecified site: Secondary | ICD-10-CM | POA: Diagnosis not present

## 2022-02-14 DIAGNOSIS — Z1331 Encounter for screening for depression: Secondary | ICD-10-CM | POA: Diagnosis not present

## 2022-02-21 DIAGNOSIS — E139 Other specified diabetes mellitus without complications: Secondary | ICD-10-CM | POA: Diagnosis not present

## 2022-02-21 DIAGNOSIS — M25552 Pain in left hip: Secondary | ICD-10-CM | POA: Diagnosis not present

## 2022-02-21 DIAGNOSIS — M7062 Trochanteric bursitis, left hip: Secondary | ICD-10-CM | POA: Diagnosis not present

## 2022-03-20 DIAGNOSIS — N3281 Overactive bladder: Secondary | ICD-10-CM | POA: Diagnosis not present

## 2022-03-20 DIAGNOSIS — N958 Other specified menopausal and perimenopausal disorders: Secondary | ICD-10-CM | POA: Diagnosis not present

## 2022-04-11 DIAGNOSIS — E119 Type 2 diabetes mellitus without complications: Secondary | ICD-10-CM | POA: Diagnosis not present

## 2022-04-11 DIAGNOSIS — Z961 Presence of intraocular lens: Secondary | ICD-10-CM | POA: Diagnosis not present

## 2022-05-15 DIAGNOSIS — N3941 Urge incontinence: Secondary | ICD-10-CM | POA: Diagnosis not present

## 2022-06-10 DIAGNOSIS — N3281 Overactive bladder: Secondary | ICD-10-CM | POA: Diagnosis not present

## 2022-06-10 DIAGNOSIS — I10 Essential (primary) hypertension: Secondary | ICD-10-CM | POA: Diagnosis not present

## 2022-06-10 DIAGNOSIS — R109 Unspecified abdominal pain: Secondary | ICD-10-CM | POA: Diagnosis not present

## 2022-07-03 DIAGNOSIS — N3941 Urge incontinence: Secondary | ICD-10-CM | POA: Diagnosis not present

## 2022-08-15 DIAGNOSIS — I10 Essential (primary) hypertension: Secondary | ICD-10-CM | POA: Diagnosis not present

## 2022-08-15 DIAGNOSIS — E1165 Type 2 diabetes mellitus with hyperglycemia: Secondary | ICD-10-CM | POA: Diagnosis not present

## 2022-08-21 DIAGNOSIS — L0100 Impetigo, unspecified: Secondary | ICD-10-CM | POA: Diagnosis not present

## 2022-08-21 DIAGNOSIS — L0889 Other specified local infections of the skin and subcutaneous tissue: Secondary | ICD-10-CM | POA: Diagnosis not present

## 2022-09-25 DIAGNOSIS — L820 Inflamed seborrheic keratosis: Secondary | ICD-10-CM | POA: Diagnosis not present

## 2022-09-25 DIAGNOSIS — D485 Neoplasm of uncertain behavior of skin: Secondary | ICD-10-CM | POA: Diagnosis not present

## 2022-11-27 DIAGNOSIS — L570 Actinic keratosis: Secondary | ICD-10-CM | POA: Diagnosis not present

## 2022-11-27 DIAGNOSIS — L565 Disseminated superficial actinic porokeratosis (DSAP): Secondary | ICD-10-CM | POA: Diagnosis not present

## 2022-11-27 DIAGNOSIS — D2239 Melanocytic nevi of other parts of face: Secondary | ICD-10-CM | POA: Diagnosis not present

## 2022-11-27 DIAGNOSIS — L820 Inflamed seborrheic keratosis: Secondary | ICD-10-CM | POA: Diagnosis not present

## 2022-11-27 DIAGNOSIS — L821 Other seborrheic keratosis: Secondary | ICD-10-CM | POA: Diagnosis not present

## 2022-11-27 DIAGNOSIS — D225 Melanocytic nevi of trunk: Secondary | ICD-10-CM | POA: Diagnosis not present

## 2022-12-08 ENCOUNTER — Other Ambulatory Visit: Payer: Self-pay | Admitting: Internal Medicine

## 2022-12-08 DIAGNOSIS — Z1231 Encounter for screening mammogram for malignant neoplasm of breast: Secondary | ICD-10-CM

## 2023-01-14 ENCOUNTER — Ambulatory Visit: Payer: Medicare Other

## 2023-01-29 ENCOUNTER — Ambulatory Visit
Admission: RE | Admit: 2023-01-29 | Discharge: 2023-01-29 | Disposition: A | Discharge status: Home / Self Care | Payer: Medicare Other | Source: Ambulatory Visit | Attending: Internal Medicine | Admitting: Internal Medicine

## 2023-01-29 DIAGNOSIS — Z1231 Encounter for screening mammogram for malignant neoplasm of breast: Secondary | ICD-10-CM

## 2023-02-18 DIAGNOSIS — Z79899 Other long term (current) drug therapy: Secondary | ICD-10-CM | POA: Diagnosis not present

## 2023-02-18 DIAGNOSIS — I10 Essential (primary) hypertension: Secondary | ICD-10-CM | POA: Diagnosis not present

## 2023-02-18 DIAGNOSIS — Z1211 Encounter for screening for malignant neoplasm of colon: Secondary | ICD-10-CM | POA: Diagnosis not present

## 2023-02-18 DIAGNOSIS — E559 Vitamin D deficiency, unspecified: Secondary | ICD-10-CM | POA: Diagnosis not present

## 2023-02-18 DIAGNOSIS — Z23 Encounter for immunization: Secondary | ICD-10-CM | POA: Diagnosis not present

## 2023-02-18 DIAGNOSIS — E7800 Pure hypercholesterolemia, unspecified: Secondary | ICD-10-CM | POA: Diagnosis not present

## 2023-02-18 DIAGNOSIS — E669 Obesity, unspecified: Secondary | ICD-10-CM | POA: Diagnosis not present

## 2023-02-18 DIAGNOSIS — H8110 Benign paroxysmal vertigo, unspecified ear: Secondary | ICD-10-CM | POA: Diagnosis not present

## 2023-02-18 DIAGNOSIS — M8589 Other specified disorders of bone density and structure, multiple sites: Secondary | ICD-10-CM | POA: Diagnosis not present

## 2023-02-18 DIAGNOSIS — E1165 Type 2 diabetes mellitus with hyperglycemia: Secondary | ICD-10-CM | POA: Diagnosis not present

## 2023-03-07 IMAGING — CT CT HEAD W/O CM
4 series · 17 of 47 positions shown, 19 images · non-contrast
Comparison: None.

CLINICAL DATA: Head trauma, minor (Age >= 65y) Previous fall with
head injury and LOC, no headache and off-balance

EXAM:
CT HEAD WITHOUT CONTRAST
TECHNIQUE: Contiguous axial images were obtained from the base of the skull
through the vertex without intravenous contrast.

[Series 3: head without · axial · non-contrast · 0.45mm/px · z∈[-30,+90]mm · 7 of 32 slices shown, 9 images]
[im 4/32  brain]
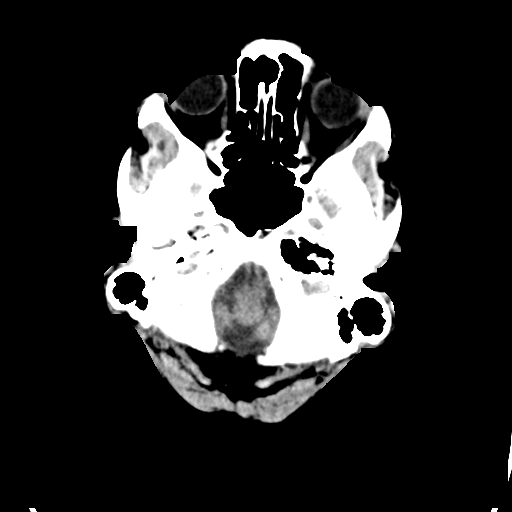
[im 4/32  bone]
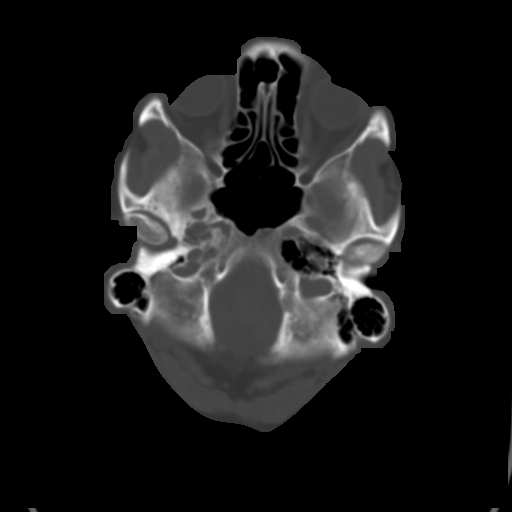
[im 8/32  brain]
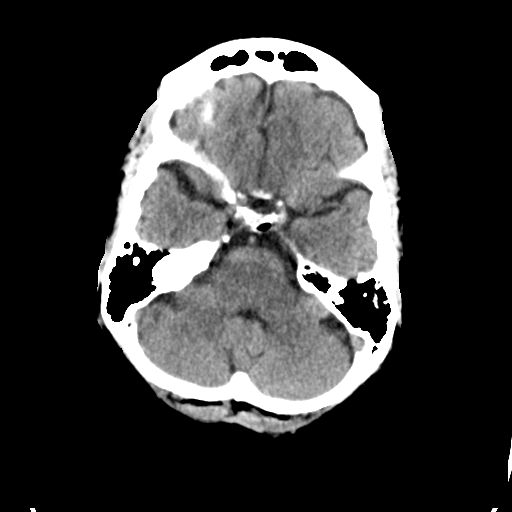
[im 12/32  brain]
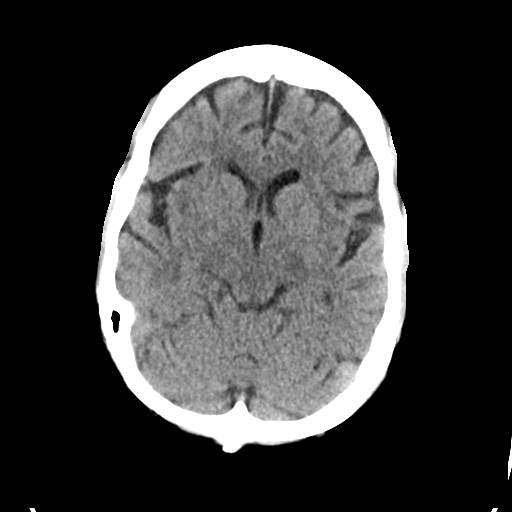
[im 16/32  brain]
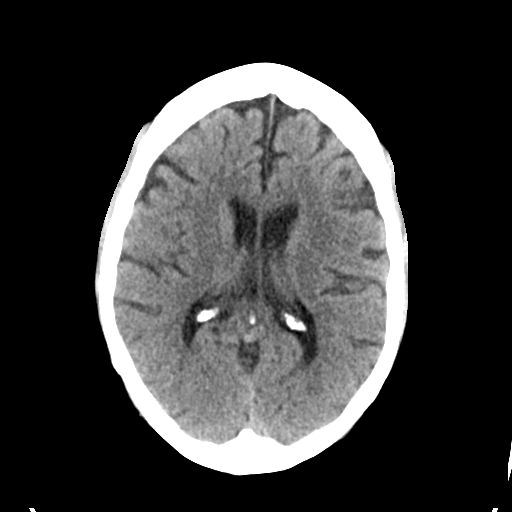
[im 20/32  brain]
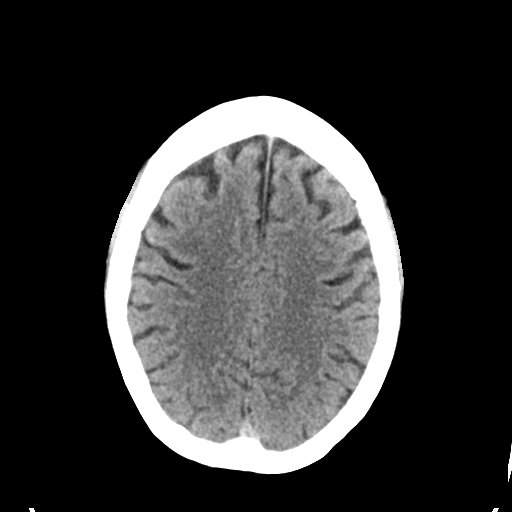
[im 20/32  bone]
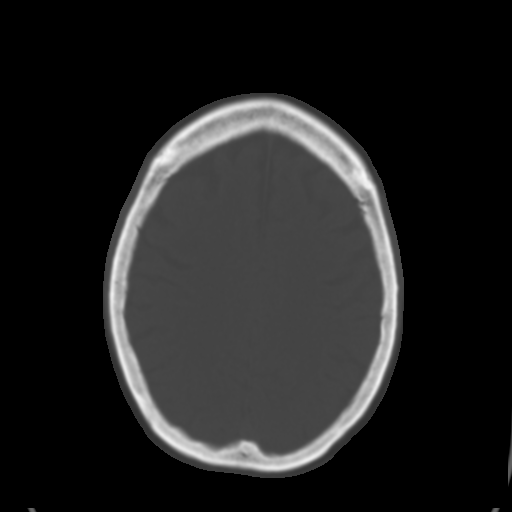
[im 24/32  brain]
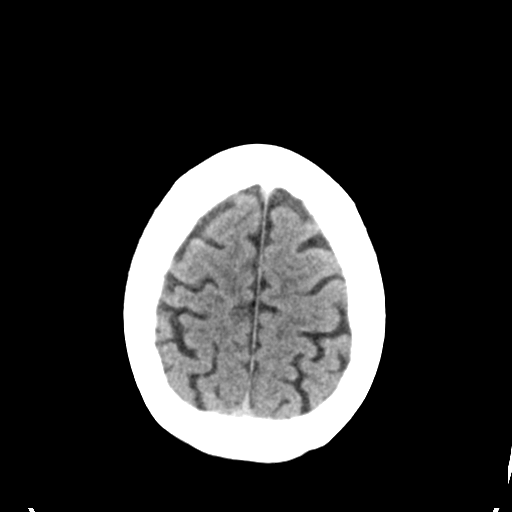
[im 28/32  brain]
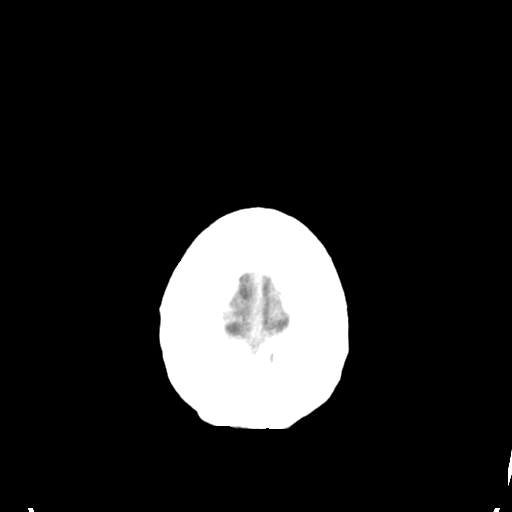

[Series 4: head bone · axial · 0.45mm/px · z∈[-31,+25]mm · 4 of 80 slices shown]
[im 8/80  bone]
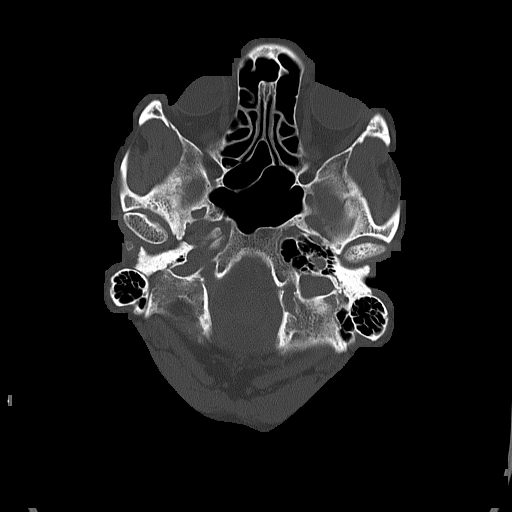
[im 16/80  bone]
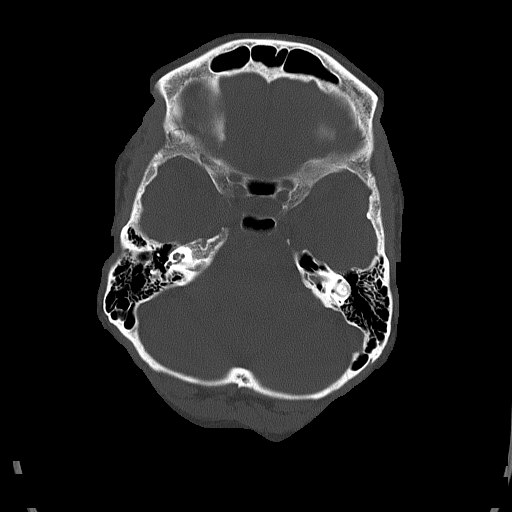
[im 24/80  bone]
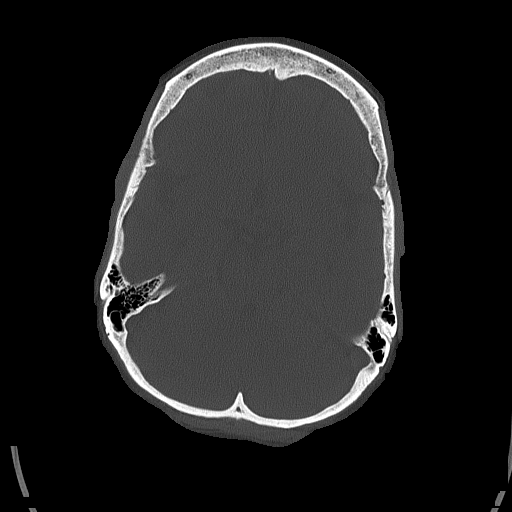
[im 36/80  bone]
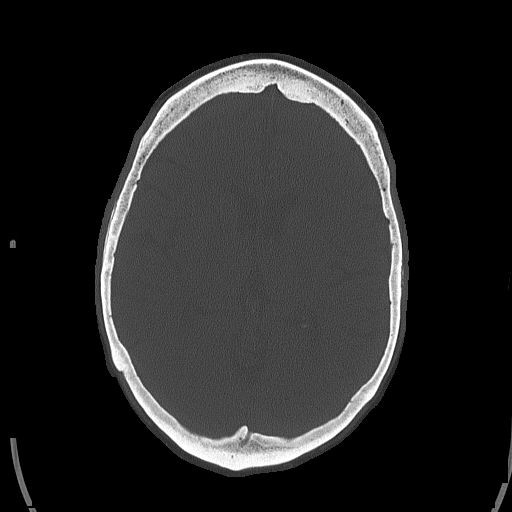

[Series 5: head without cor · coronal · non-contrast · 0.31mm/px · 3 of 67 slices shown]
[im 23/67  brain]
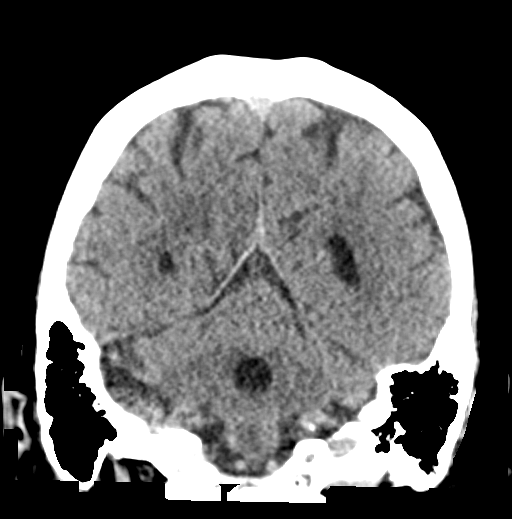
[im 30/67  brain]
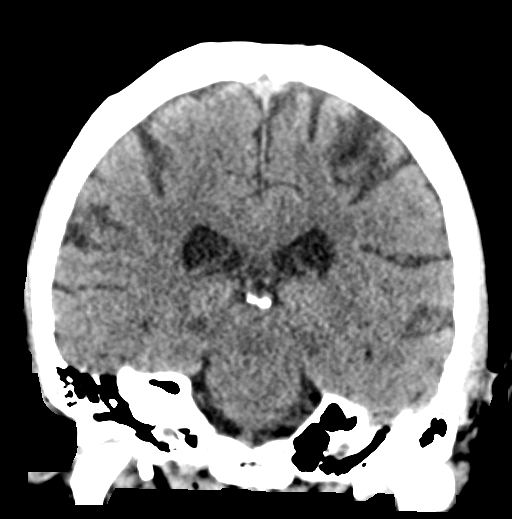
[im 37/67  brain]
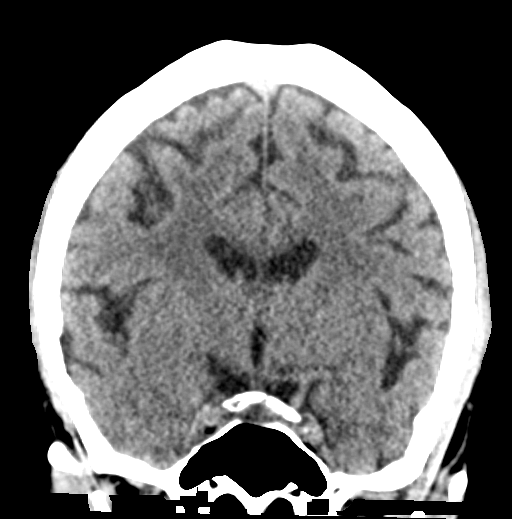

[Series 6: head without sag · sagittal · non-contrast · 0.29mm/px · 3 of 50 slices shown]
[im 17/50  brain]
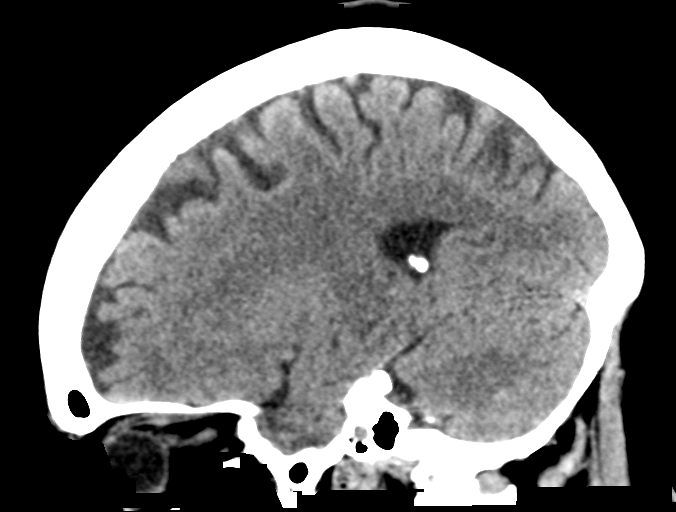
[im 25/50  brain]
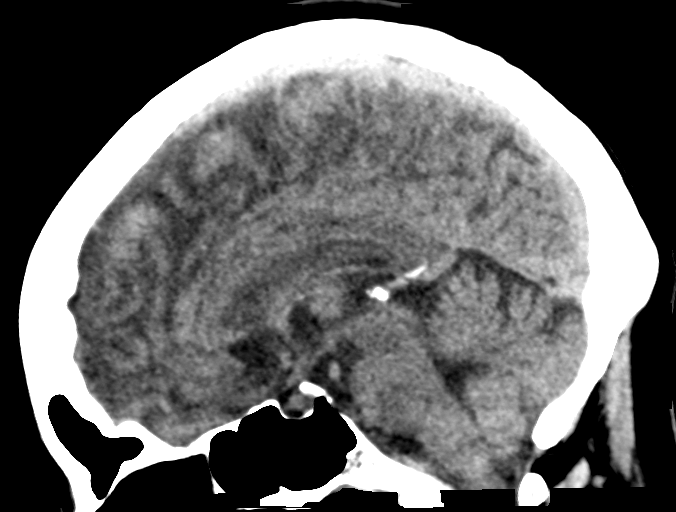
[im 33/50  brain]
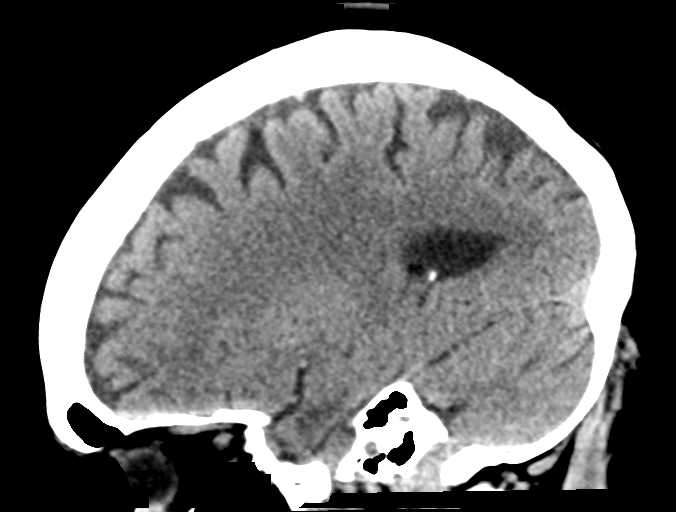

[17 of 47 positions shown; findings below may reference images not displayed]

FINDINGS: Brain: No evidence of acute infarction, hemorrhage, hydrocephalus,
extra-axial collection or mass lesion/mass effect.

Vascular: No hyperdense vessel or unexpected calcification.

Skull: Normal. Negative for fracture or focal lesion.

Sinuses/Orbits: No acute finding.

Other: Negative for scalp hematoma.
IMPRESSION: No acute intracranial findings.

## 2023-03-23 DIAGNOSIS — H2000 Unspecified acute and subacute iridocyclitis: Secondary | ICD-10-CM | POA: Diagnosis not present

## 2023-04-01 DIAGNOSIS — H2000 Unspecified acute and subacute iridocyclitis: Secondary | ICD-10-CM | POA: Diagnosis not present

## 2023-04-28 DIAGNOSIS — Z961 Presence of intraocular lens: Secondary | ICD-10-CM | POA: Diagnosis not present

## 2023-04-28 DIAGNOSIS — E119 Type 2 diabetes mellitus without complications: Secondary | ICD-10-CM | POA: Diagnosis not present

## 2023-07-14 DIAGNOSIS — R197 Diarrhea, unspecified: Secondary | ICD-10-CM | POA: Diagnosis not present

## 2023-07-14 DIAGNOSIS — Z860100 Personal history of colon polyps, unspecified: Secondary | ICD-10-CM | POA: Diagnosis not present

## 2023-08-06 DIAGNOSIS — Z09 Encounter for follow-up examination after completed treatment for conditions other than malignant neoplasm: Secondary | ICD-10-CM | POA: Diagnosis not present

## 2023-08-06 DIAGNOSIS — D123 Benign neoplasm of transverse colon: Secondary | ICD-10-CM | POA: Diagnosis not present

## 2023-08-06 DIAGNOSIS — Z860101 Personal history of adenomatous and serrated colon polyps: Secondary | ICD-10-CM | POA: Diagnosis not present

## 2023-08-11 DIAGNOSIS — D123 Benign neoplasm of transverse colon: Secondary | ICD-10-CM | POA: Diagnosis not present

## 2023-08-27 DIAGNOSIS — E1165 Type 2 diabetes mellitus with hyperglycemia: Secondary | ICD-10-CM | POA: Diagnosis not present

## 2023-08-27 DIAGNOSIS — E669 Obesity, unspecified: Secondary | ICD-10-CM | POA: Diagnosis not present

## 2023-08-27 DIAGNOSIS — N3281 Overactive bladder: Secondary | ICD-10-CM | POA: Diagnosis not present

## 2023-08-27 DIAGNOSIS — I10 Essential (primary) hypertension: Secondary | ICD-10-CM | POA: Diagnosis not present

## 2023-09-20 ENCOUNTER — Other Ambulatory Visit: Payer: Self-pay

## 2023-09-20 ENCOUNTER — Encounter (HOSPITAL_BASED_OUTPATIENT_CLINIC_OR_DEPARTMENT_OTHER): Payer: Self-pay

## 2023-09-20 ENCOUNTER — Emergency Department (HOSPITAL_BASED_OUTPATIENT_CLINIC_OR_DEPARTMENT_OTHER): Admission: EM | Admit: 2023-09-20 | Discharge: 2023-09-20 | Disposition: A | Discharge status: Home / Self Care

## 2023-09-20 DIAGNOSIS — R112 Nausea with vomiting, unspecified: Secondary | ICD-10-CM | POA: Insufficient documentation

## 2023-09-20 DIAGNOSIS — R197 Diarrhea, unspecified: Secondary | ICD-10-CM | POA: Insufficient documentation

## 2023-09-20 DIAGNOSIS — Z7984 Long term (current) use of oral hypoglycemic drugs: Secondary | ICD-10-CM | POA: Diagnosis not present

## 2023-09-20 DIAGNOSIS — I10 Essential (primary) hypertension: Secondary | ICD-10-CM | POA: Diagnosis not present

## 2023-09-20 DIAGNOSIS — R110 Nausea: Secondary | ICD-10-CM | POA: Diagnosis not present

## 2023-09-20 DIAGNOSIS — R55 Syncope and collapse: Secondary | ICD-10-CM | POA: Insufficient documentation

## 2023-09-20 DIAGNOSIS — R42 Dizziness and giddiness: Secondary | ICD-10-CM | POA: Diagnosis not present

## 2023-09-20 DIAGNOSIS — E119 Type 2 diabetes mellitus without complications: Secondary | ICD-10-CM | POA: Insufficient documentation

## 2023-09-20 LAB — URINALYSIS, ROUTINE W REFLEX MICROSCOPIC
Bilirubin Urine: NEGATIVE
Glucose, UA: >1000 mg/dL — AB
Hgb urine dipstick: NEGATIVE
Ketones, ur: 40 mg/dL — AB
Nitrite: NEGATIVE
Protein, ur: NEGATIVE mg/dL
Specific Gravity, Urine: 1.031 — ABNORMAL HIGH (ref 1.005–1.030)
pH: 5 (ref 5.0–8.0)

## 2023-09-20 LAB — COMPREHENSIVE METABOLIC PANEL WITH GFR
ALT: 13 U/L (ref 0–44)
AST: 19 U/L (ref 15–41)
Albumin: 4.1 g/dL (ref 3.5–5.0)
Alkaline Phosphatase: 69 U/L (ref 38–126)
Anion gap: 14 (ref 5–15)
BUN: 21 mg/dL (ref 8–23)
CO2: 21 mmol/L — ABNORMAL LOW (ref 22–32)
Calcium: 9 mg/dL (ref 8.9–10.3)
Chloride: 105 mmol/L (ref 98–111)
Creatinine, Ser: 0.71 mg/dL (ref 0.44–1.00)
GFR, Estimated: >60 mL/min (ref >60)
Glucose, Bld: 155 mg/dL — ABNORMAL HIGH (ref 70–99)
Potassium: 4 mmol/L (ref 3.5–5.1)
Sodium: 140 mmol/L (ref 135–145)
Total Bilirubin: 0.4 mg/dL (ref 0.0–1.2)
Total Protein: 6.7 g/dL (ref 6.5–8.1)

## 2023-09-20 LAB — CBC
HCT: 42.6 % (ref 36.0–46.0)
Hemoglobin: 13.5 g/dL (ref 12.0–15.0)
MCH: 26.5 pg (ref 26.0–34.0)
MCHC: 31.7 g/dL (ref 30.0–36.0)
MCV: 83.5 fL (ref 80.0–100.0)
Platelets: 217 10*3/uL (ref 150–400)
RBC: 5.1 MIL/uL (ref 3.87–5.11)
RDW: 15.3 % (ref 11.5–15.5)
WBC: 9.2 10*3/uL (ref 4.0–10.5)
nRBC: 0 % (ref 0.0–0.2)

## 2023-09-20 LAB — LIPASE, BLOOD: Lipase: 61 U/L — ABNORMAL HIGH (ref 11–51)

## 2023-09-20 MED ORDER — DIPHENHYDRAMINE HCL 50 MG/ML IJ SOLN
12.5000 mg | Freq: Once | INTRAMUSCULAR | Status: AC
  Administered 2023-09-20: 12.5 mg via INTRAVENOUS
  Filled 2023-09-20: qty 1

## 2023-09-20 MED ORDER — ONDANSETRON 4 MG PO TBDP: 4.0000 mg | ORAL_TABLET | Freq: Three times a day (TID) | ORAL | 0 refills | Status: AC | PRN

## 2023-09-20 MED ORDER — METOCLOPRAMIDE HCL 5 MG/ML IJ SOLN
5.0000 mg | Freq: Once | INTRAMUSCULAR | Status: AC
  Administered 2023-09-20: 5 mg via INTRAVENOUS
  Filled 2023-09-20: qty 2

## 2023-09-20 MED ORDER — SODIUM CHLORIDE 0.9 % IV BOLUS
1000.0000 mL | Freq: Once | INTRAVENOUS | Status: AC
  Administered 2023-09-20: 1000 mL via INTRAVENOUS

## 2023-09-20 NOTE — Discharge Instructions (Addendum)
 Please read and follow all provided instructions.  Your diagnoses today include:  1. Nausea vomiting and diarrhea     Tests performed today include: Complete blood cell count: Was normal Complete metabolic panel: Was normal Lipase (pancreas function test): Slightly high but not concerning Urinalysis (urine test): There were some infection fighting cells Vital signs. See below for your results today.   Medications prescribed:  Zofran  (ondansetron ) - for nausea and vomiting  Take any prescribed medications only as directed.  Home care instructions:  Follow any educational materials contained in this packet.  You should rest for the next several days. Keep drinking plenty of fluids and use the medicine for nausea as directed.   Drink clear liquids for the next 24 hours and introduce solid foods slowly after 24 hours using the b.r.a.t. diet (Bananas, Rice, Applesauce, Toast, Yogurt).    Follow-up instructions: Please follow-up with your primary care provider in the next 2 days for further evaluation of your symptoms. If you are not feeling better in 48 hours you may have a condition that is more serious and you need re-evaluation.   Return instructions:  SEEK IMMEDIATE MEDICAL ATTENTION IF: If you have pain that does not go away or becomes severe  A temperature above 101F develops  Repeated vomiting occurs (multiple episodes)  If you have pain that becomes localized to portions of the abdomen. The right side could possibly be appendicitis. In an adult, the left lower portion of the abdomen could be colitis or diverticulitis.  Blood is being passed in stools or vomit (bright red or black tarry stools)  You develop chest pain, difficulty breathing, dizziness or fainting, or become confused, poorly responsive, or inconsolable (young children) If you have any other emergent concerns regarding your health  Additional Information: Abdominal (belly) pain can be caused by many things. Your  caregiver performed an examination and possibly ordered blood/urine tests and imaging (CT scan, x-rays, ultrasound). Many cases can be observed and treated at home after initial evaluation in the emergency department. Even though you are being discharged home, abdominal pain can be unpredictable. Therefore, you need a repeated exam if your pain does not resolve, returns, or worsens. Most patients with abdominal pain don't have to be admitted to the hospital or have surgery, but serious problems like appendicitis and gallbladder attacks can start out as nonspecific pain. Many abdominal conditions cannot be diagnosed in one visit, so follow-up evaluations are very important.  Your vital signs today were: BP (!) 133/52   Pulse 90   Temp 99.4 F (37.4 C) (Oral)   Resp 18   Ht 5\' 4"  (1.626 m)   Wt 70.8 kg   SpO2 96%   BMI 26.78 kg/m  If your blood pressure (bp) was elevated above 135/85 this visit, please have this repeated by your doctor within one month. --------------

## 2023-09-20 NOTE — ED Triage Notes (Addendum)
 Patient arrives via EMS with complaints of sudden onset of vomiting, diarrhea, and suspected syncopal episode today.   Given 500cc IV bolus and IV zofran  en route.  140/60 BP with EMS.  Hx of syncope

## 2023-09-20 NOTE — ED Provider Notes (Signed)
 [place] EMERGENCY DEPARTMENT AT [facility] ([place]) Provider Note   CSN: [id number] Arrival date & time: 09/20/23  1301     History  Chief Complaint  Patient presents with   Emesis   Diarrhea    Brenda Butler is a 78 y.o. female.  Patient with history of vasovagal syncope, diabetes, high cholesterol, high blood pressure on medications including metoprolol  -- presents to the emergency department today for evaluation of vomiting, diarrhea and syncopal episode.  Patient reports being very active yesterday, power washing her porch.  She was drinking well and felt well.  She woke around 4 AM with nausea.  She had Zofran  at home which she took.  Typically with previous episodes, Zofran  is helpful, she will go back to sleep and then wake up feeling well.  However symptoms progressed.  She developed diarrhea that was watery and nonbloody.  She had episodes of vomiting.  While using the bathroom at 1 point, she felt very lightheaded.  When this is happened to her before, she would lie down on the floor.  She did this and then possibly syncopized.  She lives by herself.  She recovered and called her sister who came over.  EMS was called.  They administered IV fluids and Zofran .  Ultimately the patient did not feel sufficiently improved and opted to come to the hospital.  She has had a history of A-fib in the past.  She reports recent sick contacts.  2 days ago she was at a birthday party.  She was around a lot of people.  She reports that 4-5 people that were at the party have had similar symptoms.  Patient was given Zofran  and fluid bolus by EMS.       Home Medications Prior to Admission medications   Medication Sig Start Date End Date Taking? Authorizing Provider  acetaminophen  (TYLENOL ) 500 MG tablet Take 500 mg by mouth every 6 (six) hours as needed for mild pain.    [provider]  Cholecalciferol  (VITAMIN D ) 2000 UNITS tablet Take 2,000 Units by mouth daily.    [provider]  diphenoxylate -atropine  (LOMOTIL ) 2.5-0.025 MG tablet Take 1 tablet by mouth 4 (four) times daily as needed for diarrhea or loose stools. 05/16/16   Eino Gravel, MD  doxepin  (SINEQUAN ) 10 MG capsule Take 10 mg by mouth at bedtime as needed (ITCHING).     [provider]  doxycycline (MONODOX) 50 MG capsule Take 50 mg by mouth 2 (two) times daily. continuous 04/16/21   [provider]  ferrous sulfate  325 (65 FE) MG tablet Take 325 mg by mouth 3 (three) times a week. Mon. Wed. fri    [provider]  JARDIANCE 25 MG TABS tablet Take 25 mg by mouth daily. 03/05/21   [provider]  meclizine  (ANTIVERT ) 25 MG tablet Take 1 tablet (25 mg total) by mouth 3 (three) times daily as needed for dizziness. 04/22/21   Horton, Kristie M, DO  metFORMIN (GLUCOPHAGE-XR) 500 MG 24 hr tablet Take 1,000-1,500 mg by mouth See admin instructions. 3 one night and 2 the next. 09/16/16   [provider]  metoprolol  succinate (TOPROL -XL) 25 MG 24 hr tablet Take 12.5-25 mg by mouth See admin instructions. Sat. Sun. Tues, thurs. (12.5mg ) all other days take 25mg  02/06/17   [provider]  ondansetron  (ZOFRAN  ODT) 4 MG disintegrating tablet Take 1 tablet (4 mg total) by mouth every 8 (eight) hours as needed for nausea. Patient taking differently: Take 8  mg by mouth every 8 (eight) hours as needed for nausea. 05/16/16   Eino Gravel, MD  rosuvastatin (CRESTOR) 5 MG tablet Take 5 mg by mouth See admin instructions. Patient takes twice weekly-Saturday and Wednesday 03/15/21   [provider]  solifenacin (VESICARE) 5 MG tablet Take 5 mg by mouth daily.    [provider]  spironolactone (ALDACTONE) 25 MG tablet Take 12.5 mg by mouth daily. 01/26/17   [provider]  valACYclovir (VALTREX) 1000 MG tablet Take 1,000 mg by mouth 2 (two) times daily as needed (herpes). 06/18/17   [provider]      Allergies    Penicillins,  Tetanus-diphtheria toxoids td, Codeine , Colchicine, Iron, and Metformin and related    Review of Systems   Review of Systems  Physical Exam Updated Vital Signs BP (!) 142/62   Pulse 88   Temp 99.4 F (37.4 C) (Oral)   Resp 20   Ht 5\' 4"  (1.626 m)   Wt 70.8 kg   SpO2 98%   BMI 26.78 kg/m  Physical Exam Vitals and nursing note reviewed.  Constitutional:      General: She is not in acute distress.    Appearance: She is well-developed.  HENT:     Head: Normocephalic and atraumatic.     Right Ear: External ear normal.     Left Ear: External ear normal.     Nose: Nose normal.  Eyes:     Conjunctiva/sclera: Conjunctivae normal.  Cardiovascular:     Rate and Rhythm: Normal rate and regular rhythm.     Heart sounds: No murmur heard.    Comments: Regular rhythm, normal rate Pulmonary:     Effort: No respiratory distress.     Breath sounds: No wheezing, rhonchi or rales.  Abdominal:     Palpations: Abdomen is soft.     Tenderness: There is no abdominal tenderness. There is no guarding or rebound.  Musculoskeletal:     Cervical back: Normal range of motion and neck supple.     Right lower leg: No edema.     Left lower leg: No edema.  Skin:    General: Skin is warm and dry.     Findings: No rash.  Neurological:     General: No focal deficit present.     Mental Status: She is alert. Mental status is at baseline.     Motor: No weakness.  Psychiatric:        Mood and Affect: Mood normal.     ED Results / Procedures / Treatments   Labs (all labs ordered are listed, but only abnormal results are displayed) Labs Reviewed  LIPASE, BLOOD - Abnormal; Notable for the following components:      Result Value   Lipase 61 (*)    All other components within normal limits  COMPREHENSIVE METABOLIC PANEL WITH GFR - Abnormal; Notable for the following components:   CO2 21 (*)    Glucose, Bld 155 (*)    All other components within normal limits  URINALYSIS, ROUTINE W REFLEX  MICROSCOPIC - Abnormal; Notable for the following components:   Specific Gravity, Urine 1.031 (*)    Glucose, UA >1,000 (*)    Ketones, ur 40 (*)    Leukocytes,Ua SMALL (*)    Bacteria, UA RARE (*)    All other components within normal limits  CBC    ED ECG REPORT   Date: 09/20/2023  Rate: 91  Rhythm: normal sinus rhythm  QRS Axis: normal  Intervals: normal  ST/T Wave abnormalities: normal  Conduction Disutrbances:none  Narrative Interpretation:   Old EKG Reviewed: unchanged  I have personally reviewed the EKG tracing and agree with the computerized printout as noted.   Radiology No results found.  Procedures Procedures    Medications Ordered in ED Medications  sodium chloride  0.9 % bolus 1,000 mL (0 mLs Intravenous Stopped 09/20/23 1610)  metoCLOPramide  (REGLAN ) injection 5 mg (5 mg Intravenous Given 09/20/23 1623)  diphenhydrAMINE (BENADRYL) injection 12.5 mg (12.5 mg Intravenous Given 09/20/23 1623)    ED Course/ Medical Decision Making/ A&P    Patient seen and examined. History obtained directly from patient.   Labs/EKG: Ordered CBC, CMP, lipase, UA, EKG.  EKG personally reviewed and interpreted as above, was normal.  Imaging: None ordered  Medications/Fluids: Ordered: Additional IV fluid bolus.   Most recent vital signs reviewed and are as follows: BP (!) 142/62   Pulse 88   Temp 99.4 F (37.4 C) (Oral)   Resp 20   Ht 5\' 4"  (1.626 m)   Wt 70.8 kg   SpO2 98%   BMI 26.78 kg/m   Initial impression: Nausea, vomiting, diarrhea, possible foodborne illness or gastroenteritis given contacts with party guests who now have similar symptoms.  4:10 PM Reassessment performed. Patient appears stable.  She continues to have significant nausea with movement.  This is not a true vertigo as patient does not have a spinning sensation.  This was exacerbated when she got up to provide a urine sample.  Labs personally reviewed and interpreted including: CBC unremarkable;  CMP unremarkable with normal kidney function and electrolyte; mildly elevated lipase at 61; UA with some white blood cells, mucus present, glucose present.   Imaging personally visualized and interpreted including: None ordered.  Reviewed pertinent lab work and imaging with patient at bedside. Questions answered.   Most current vital signs reviewed and are as follows: BP (!) 146/65   Pulse 93   Temp 99.4 F (37.4 C) (Oral)   Resp 13   Ht 5\' 4"  (1.626 m)   Wt 70.8 kg   SpO2 95%   BMI 26.78 kg/m   Plan: Will continue to treat nausea.  Will give 5 mg of Reglan  and 12.5 mg diphenhydramine.  6:07 PM Reassessment performed. Patient appears comfortable.  Tolerating ginger ale.  She feels better and is comfortable with discharge.  Reviewed pertinent lab work and imaging with patient at bedside. Questions answered.   Most current vital signs reviewed and are as follows: BP (!) 133/52   Pulse 90   Temp 99.4 F (37.4 C) (Oral)   Resp 18   Ht 5\' 4"  (1.626 m)   Wt 70.8 kg   SpO2 96%   BMI 26.78 kg/m   Plan: Discharge to home.   Prescriptions written for: Zofran   Other home care instructions discussed: Clear liquid diet for the next 12 to 24 hours and then slow advancement  ED return instructions discussed: The patient was urged to return to the Emergency Department immediately with worsening of current symptoms, worsening abdominal pain, persistent vomiting, blood noted in stools, fever, or any other concerns. The patient verbalized understanding.   Follow-up instructions discussed: Patient encouraged to follow-up with their PCP in 3 days.                                   Medical Decision Making Amount and/or Complexity of Data Reviewed Labs:  ordered.  Risk Prescription drug management.   For this patient's complaint of N/V/D, the following conditions were considered on the differential diagnosis: gastritis/PUD, enteritis/duodenitis, appendicitis,  cholelithiasis/cholecystitis, cholangitis, pancreatitis, ruptured viscus, colitis, diverticulitis, small/large bowel obstruction, proctitis, cystitis, pyelonephritis, ureteral colic, aortic dissection, aortic aneurysm. In women, ectopic pregnancy, pelvic inflammatory disease, ovarian cysts, and tubo-ovarian abscess were also considered. Atypical chest etiologies were also considered including ACS, PE, and pneumonia.  Concern for foodborne illness or norovirus given multiple sick contacts.  Patient also with history of syncope, with possible syncopal episode in setting of diarrhea and nausea.  She is improved from the standpoint after IV fluids.  No associated chest pain or shortness of breath.  Overall low concern for arrhythmia or other significant cardiac involvement.  The patient's vital signs, pertinent lab work and imaging were reviewed and interpreted as discussed in the ED course. Hospitalization was considered for further testing, treatments, or serial exams/observation. However as patient is well-appearing, has a stable exam, and reassuring studies today, I do not feel that they warrant admission at this time. This plan was discussed with the patient who verbalizes agreement and comfort with this plan and seems reliable and able to return to the Emergency Department with worsening or changing symptoms.           Final Clinical Impression(s) / ED Diagnoses Final diagnoses:  Nausea vomiting and diarrhea  Syncope, unspecified syncope type    Rx / DC Orders ED Discharge Orders          Ordered    ondansetron  (ZOFRAN -ODT) 4 MG disintegrating tablet  Every 8 hours PRN        09/20/23 1806              Elysse Polidore, PA-C 09/20/23 1808    Rolinda Climes, DO 09/23/23 2342

## 2023-11-30 DIAGNOSIS — L821 Other seborrheic keratosis: Secondary | ICD-10-CM | POA: Diagnosis not present

## 2023-11-30 DIAGNOSIS — D2239 Melanocytic nevi of other parts of face: Secondary | ICD-10-CM | POA: Diagnosis not present

## 2023-12-24 ENCOUNTER — Other Ambulatory Visit: Payer: Self-pay | Admitting: Internal Medicine

## 2023-12-24 DIAGNOSIS — Z1231 Encounter for screening mammogram for malignant neoplasm of breast: Secondary | ICD-10-CM

## 2024-02-02 ENCOUNTER — Ambulatory Visit
Admission: RE | Admit: 2024-02-02 | Discharge: 2024-02-02 | Disposition: A | Discharge status: Home / Self Care | Source: Ambulatory Visit | Attending: Internal Medicine | Admitting: Internal Medicine

## 2024-02-02 DIAGNOSIS — Z1231 Encounter for screening mammogram for malignant neoplasm of breast: Secondary | ICD-10-CM | POA: Diagnosis not present

## 2024-02-25 DIAGNOSIS — Z79899 Other long term (current) drug therapy: Secondary | ICD-10-CM | POA: Diagnosis not present

## 2024-02-25 DIAGNOSIS — M8589 Other specified disorders of bone density and structure, multiple sites: Secondary | ICD-10-CM | POA: Diagnosis not present

## 2024-02-25 DIAGNOSIS — E559 Vitamin D deficiency, unspecified: Secondary | ICD-10-CM | POA: Diagnosis not present

## 2024-02-25 DIAGNOSIS — Z0000 Encounter for general adult medical examination without abnormal findings: Secondary | ICD-10-CM | POA: Diagnosis not present

## 2024-02-25 DIAGNOSIS — Z23 Encounter for immunization: Secondary | ICD-10-CM | POA: Diagnosis not present

## 2024-02-25 DIAGNOSIS — E1165 Type 2 diabetes mellitus with hyperglycemia: Secondary | ICD-10-CM | POA: Diagnosis not present

## 2024-02-25 DIAGNOSIS — E7800 Pure hypercholesterolemia, unspecified: Secondary | ICD-10-CM | POA: Diagnosis not present

## 2024-02-25 DIAGNOSIS — B351 Tinea unguium: Secondary | ICD-10-CM | POA: Diagnosis not present

## 2024-02-25 DIAGNOSIS — I10 Essential (primary) hypertension: Secondary | ICD-10-CM | POA: Diagnosis not present

## 2024-02-25 DIAGNOSIS — Z1331 Encounter for screening for depression: Secondary | ICD-10-CM | POA: Diagnosis not present

## 2024-02-25 DIAGNOSIS — R040 Epistaxis: Secondary | ICD-10-CM | POA: Diagnosis not present

## 2024-02-25 DIAGNOSIS — N3281 Overactive bladder: Secondary | ICD-10-CM | POA: Diagnosis not present

## 2024-04-27 DIAGNOSIS — Z961 Presence of intraocular lens: Secondary | ICD-10-CM | POA: Diagnosis not present

## 2024-04-27 DIAGNOSIS — E119 Type 2 diabetes mellitus without complications: Secondary | ICD-10-CM | POA: Diagnosis not present
